# Patient Record
Sex: Male | Born: 1945
Health system: Southern US, Community
[De-identification: ages and names within clinical notes are randomized; demographics above are authoritative.]

## PROBLEM LIST (undated history)

## (undated) DIAGNOSIS — Z87891 Personal history of nicotine dependence: Secondary | ICD-10-CM

## (undated) DIAGNOSIS — IMO0001 Reserved for inherently not codable concepts without codable children: Secondary | ICD-10-CM

## (undated) DIAGNOSIS — R011 Cardiac murmur, unspecified: Secondary | ICD-10-CM

## (undated) DIAGNOSIS — Z8719 Personal history of other diseases of the digestive system: Secondary | ICD-10-CM

## (undated) DIAGNOSIS — Z9289 Personal history of other medical treatment: Secondary | ICD-10-CM

## (undated) DIAGNOSIS — Z9889 Other specified postprocedural states: Secondary | ICD-10-CM

## (undated) DIAGNOSIS — I10 Essential (primary) hypertension: Secondary | ICD-10-CM

## (undated) DIAGNOSIS — I059 Rheumatic mitral valve disease, unspecified: Secondary | ICD-10-CM

## (undated) DIAGNOSIS — K5190 Ulcerative colitis, unspecified, without complications: Secondary | ICD-10-CM

## (undated) DIAGNOSIS — C61 Malignant neoplasm of prostate: Secondary | ICD-10-CM

## (undated) DIAGNOSIS — K219 Gastro-esophageal reflux disease without esophagitis: Secondary | ICD-10-CM

## (undated) HISTORY — DX: Ulcerative colitis, unspecified, without complications: K51.90

## (undated) HISTORY — DX: Reserved for inherently not codable concepts without codable children: IMO0001

## (undated) HISTORY — DX: Personal history of other medical treatment: Z92.89

## (undated) HISTORY — DX: Personal history of nicotine dependence: Z87.891

## (undated) HISTORY — DX: Rheumatic mitral valve disease, unspecified: I05.9

## (undated) HISTORY — DX: Gastro-esophageal reflux disease without esophagitis: K21.9

## (undated) HISTORY — DX: Personal history of other diseases of the digestive system: Z87.19

## (undated) HISTORY — DX: Cardiac murmur, unspecified: R01.1

## (undated) HISTORY — DX: Other specified postprocedural states: Z98.890

## (undated) HISTORY — DX: Malignant neoplasm of prostate: C61

## (undated) HISTORY — DX: Essential (primary) hypertension: I10

---
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
## 1981-12-09 HISTORY — PX: LUMBAR DISC SURGERY: SHX700

## 2001-07-01 ENCOUNTER — Encounter (INDEPENDENT_AMBULATORY_CARE_PROVIDER_SITE_OTHER): Payer: Self-pay | Admitting: Specialist

## 2001-07-01 ENCOUNTER — Other Ambulatory Visit: Admission: RE | Admit: 2001-07-01 | Discharge: 2001-07-01 | Payer: Self-pay | Admitting: Urology

## 2001-08-05 ENCOUNTER — Encounter: Payer: Self-pay | Admitting: Urology

## 2001-08-09 HISTORY — PX: PROSTATECTOMY: SHX69

## 2001-08-12 ENCOUNTER — Inpatient Hospital Stay (HOSPITAL_COMMUNITY): Admission: RE | Admit: 2001-08-12 | Discharge: 2001-08-15 | Payer: Self-pay | Admitting: Urology

## 2001-08-12 ENCOUNTER — Encounter (INDEPENDENT_AMBULATORY_CARE_PROVIDER_SITE_OTHER): Payer: Self-pay | Admitting: *Deleted

## 2005-12-05 ENCOUNTER — Ambulatory Visit: Payer: Self-pay | Admitting: Oncology

## 2005-12-17 ENCOUNTER — Ambulatory Visit: Admission: RE | Admit: 2005-12-17 | Discharge: 2006-02-28 | Payer: Self-pay | Admitting: Radiation Oncology

## 2006-06-12 ENCOUNTER — Ambulatory Visit: Admission: RE | Admit: 2006-06-12 | Discharge: 2006-07-30 | Payer: Self-pay | Admitting: Radiation Oncology

## 2006-06-12 LAB — CBC WITH DIFFERENTIAL/PLATELET
Basophils Absolute: 0 10*3/uL (ref 0.0–0.1)
Eosinophils Absolute: 0.2 10*3/uL (ref 0.0–0.5)
HCT: 44.5 % (ref 38.7–49.9)
HGB: 15.4 g/dL (ref 13.0–17.1)
MONO#: 0.6 10*3/uL (ref 0.1–0.9)
NEUT%: 76.9 % — ABNORMAL HIGH (ref 40.0–75.0)
Platelets: 351 10*3/uL (ref 145–400)
WBC: 8.2 10*3/uL (ref 4.0–10.0)
lymph#: 1.1 10*3/uL (ref 0.9–3.3)

## 2006-06-12 LAB — COMPREHENSIVE METABOLIC PANEL
ALT: 14 U/L (ref 0–40)
BUN: 11 mg/dL (ref 6–23)
CO2: 27 mEq/L (ref 19–32)
Calcium: 9.9 mg/dL (ref 8.4–10.5)
Chloride: 102 mEq/L (ref 96–112)
Creatinine, Ser: 1.08 mg/dL (ref 0.40–1.50)
Glucose, Bld: 116 mg/dL — ABNORMAL HIGH (ref 70–99)

## 2006-06-17 ENCOUNTER — Encounter: Admission: RE | Admit: 2006-06-17 | Discharge: 2006-06-17 | Payer: Self-pay | Admitting: Geriatric Medicine

## 2006-09-08 DIAGNOSIS — K5190 Ulcerative colitis, unspecified, without complications: Secondary | ICD-10-CM

## 2006-09-08 HISTORY — DX: Ulcerative colitis, unspecified, without complications: K51.90

## 2008-01-13 ENCOUNTER — Ambulatory Visit (HOSPITAL_COMMUNITY): Admission: RE | Admit: 2008-01-13 | Discharge: 2008-01-13 | Payer: Self-pay | Admitting: Urology

## 2008-01-31 ENCOUNTER — Encounter: Admission: RE | Admit: 2008-01-31 | Discharge: 2008-01-31 | Payer: Self-pay | Admitting: Geriatric Medicine

## 2008-02-24 ENCOUNTER — Ambulatory Visit (HOSPITAL_COMMUNITY): Admission: RE | Admit: 2008-02-24 | Discharge: 2008-02-25 | Payer: Self-pay | Admitting: Neurosurgery

## 2008-12-09 HISTORY — PX: OTHER SURGICAL HISTORY: SHX169

## 2009-08-24 ENCOUNTER — Encounter: Admission: RE | Admit: 2009-08-24 | Discharge: 2009-08-24 | Payer: Self-pay | Admitting: Geriatric Medicine

## 2010-12-30 ENCOUNTER — Encounter: Payer: Self-pay | Admitting: Urology

## 2011-04-23 NOTE — Op Note (Signed)
NAMEDRAYDON, CLAIRMONT             ACCOUNT NO.:  [id number]   MEDICAL RECORD NO.:  [id number]          PATIENT TYPE:  OIB   LOCATION:  3534                         FACILITY:  [facility]   PHYSICIAN:  Donalee Citrin, M.D.        DATE OF BIRTH:  [birth year]   DATE OF PROCEDURE:  02/24/2008  DATE OF DISCHARGE:                               OPERATIVE REPORT   PREOPERATIVE DIAGNOSIS:  Lumbar spinal stenosis and ruptured disk L3-4.   PROCEDURE:  Decompress laminectomy L3-4 with microdiskectomy at L3-4  from the left side with microscopic dissection of the left L4 nerve  root.   SURGEON:  Donalee Citrin, M.D.   ASSISTANT:  Reinaldo Meeker, M.D.   ANESTHESIA:  General anesthesia.   HISTORY OF PRESENT ILLNESS:  The patient is a very pleasant 65 year old  gentleman who presents with bilateral leg pain, much worse on the left,  radiating down to his quads with neurogenic claudication at very short  distances.  Leg pain was much greater than back pain.  MRI scan showed  very large disk herniation at L3-4 causing severe spinal stenosis and  thecal sac compression with bi-foraminal stenosis at the L3-4 nerve  root.  Also showed degenerative disk disease at L4-5 and L5-S1 with  spinal stenosis at these levels as well.  Due to the patient's size of  the disk herniation, he has quadriceps weakness on preoperative exam and  after failure of conservative treatment, patient was recommended  decompressive laminectomy and diskectomy and due to the size and  location of the fragment, he was recommended bilateral decompression  with diskectomy from the left, it the worsening side of his symptoms.  Risks and benefits of the operation were explained to the patient.  He  understands and agreed to proceed forward.  Complications were  additional decompression at L4-5 due to be severe facet arthropathy and  degenerative disk disease.  The anatomy was distorted came down the 4-5  disk space, decompressed there.   Intraoperative x-ray confirmed  localization of this to be the wrong level, so attention was taken and  marched up across at the next level up.   PROCEDURE AND COURSE:  The patient brought to the OR and was induced  under general anesthesia, positioned prone on the Wilson frame.  Back is  prepped and draped in the usual sterile fashion.  Preoperatively x-ray  localized the L4 spinous process so a midline incision was made superior  to this after infiltration with 10 mL of lidocaine with epinephrine and  subperiosteal dissection carried out at the lamina of L3-4.  Intraoperative identified the L3-4 disk space.  However, there was noted  to be severe facet arthropathy and degenerative collapse at this level  and the laminar landmarks were very distorted with a very steep course  of the lamina and marked facet arthropathy, so the spinous process of  what was believed to be L3 was removed and central decompression was  begun.  There was noted be tremendous thecal sac compression in an  hourglass fashion at this level.  Using a IV Penfield the dural  plane  was developed and adequate medial facetectomy was performed on the left  side extending the laminotomy to the right only about half the distance  to the facet complex just enough to gain safe exposure to the central  dura for further decompression.  At this point, the decompression was  carried out laterally at what was believed to be the L4 nerve was  identified.  This was noted to be marked stenosis from the degenerative  facet complex and extending in the foramen.  Looking underneath the 4  root, a large ridge of what was believed to be disk herniation was  immediately identified as well as there was disk herniation at the disk  space level just inferior to this and this was all incised and cleaned  out, however, there was no large free fragment appreciated.  A probe was  placed in the disk space and intraoperative repeat x-ray confirmed  this  to be the 4-5 disk space.  This was noted to be under marked stenosis  and due to the degenerative facet complex and the marked facet  arthropathy, this was in need of decompression as well, but because this  was confirmed to be the wrong level, subperiosteal dissection was  carried further on the lamina above and the laminotomy was extended  superiorly again with aggressive laminectomy and medial facetectomies on  the left side but only partial on the right.  Then again with severe  facet arthropathy mark causing marked stenosis at the superior level,  this was all teased away with a IV Penfield under-bitten.  At this  point, the operating microscope was draped and brought onto the field  again, and working under the microscope the remainder of the lateral  gutter was under-bitten, identifying the 4 root flush to the pedicle.  A  IV Nicholos Johns was then used to dissect the 4 root off of a very large disk  herniation still contained within the ligament.  This was held away with  the D'Errico.  Annulotomy was done with an 11 blade scalpel and using a  nerve hook, a very large free fragment disk immediately was teased out  and then the disk space was further incised and cleaned out radically.  At the end of the diskectomy at this level, there was no further  stenosis on the thecal sac.  It was copiously explored with a coronary  dilating angled hockey stick and noted to have no further stenosis.  With a combination pituitary rongeurs and downgoing Epstein curette,  this disk space was cleaned out and there was no further stenosis on the  thecal sac or the L4 and the L3 pedicle was then palpated with a  coronary dilator to confirm adequate superior exposure and the L3  foramen was widely patent.  At this point, Gelfoam was overlaid at the  foraminotomy level to maintain hemostasis.  The wound was then copiously  irrigated.  Gelfoam was removed.  Gelfoam was overlaid on top of the  dura.   The muscle was then coagulated to maintain meticulous hemostasis.  Wound was copiously irrigated.  A medium Hemovac drain was placed due to  the extensiveness of the decompression to include an additional level.  Then the muscle and fascia were approximated with interrupted Vicryls  and skin closed with running 4-0 subcuticular.  Benzoin and Steri-Strips  applied.  The patient went to the recovery room in stable condition.  At  the end of the case, all sponge and needle  counts were correct.           ______________________________  Donalee Citrin, M.D.     GC/MEDQ  D:  02/24/2008  T:  02/24/2008  Job:  [id number]

## 2011-04-23 NOTE — Op Note (Signed)
NAMEDONN, ZANETTI             ACCOUNT NO.:  [id number]   MEDICAL RECORD NO.:  [id number]          PATIENT TYPE:  OIB   LOCATION:  3534                         FACILITY:  [facility]   PHYSICIAN:  Donalee Citrin, M.D.        DATE OF BIRTH:  1946/02/12   DATE OF PROCEDURE:  02/24/2008  DATE OF DISCHARGE:                               OPERATIVE REPORT   PREOPERATIVE DIAGNOSIS:  Ruptured disk L3-4, lumbar spinal stenosis L3-  4.   PROCEDURE:  Decompressive lumbar laminectomy L3-4 with diskectomy L3-4  from the left as well as decompressed laminectomy L4-5.   Dictation stopped at this point.           ______________________________  Donalee Citrin, M.D.     GC/MEDQ  D:  02/24/2008  T:  02/24/2008  Job:  [id number]

## 2011-04-26 NOTE — H&P (Signed)
[facility]  Patient:    David Warner, David Warner Visit Number: [id number] MRN: [id number]          Service Type: SUR Location: 3W 0349 01 Attending Physician:  Katherine Roan Dictated by:   Rozanna Boer., M.D. Proc. 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  LABORATORIES:  He was done for a rise in his PSA on May 2002. PSA was 4.7 at that time. It was 1.2 in December 1999, 1.3 in June 1999, 3.14 Apr 1998, and 1.08 Apr 1997.  After discussing the options, he chose radical surgery understanding the risks, including, but not limited to, incontinence, deep vein thrombosis, bleeding, impotence, pulmonary emboli and death. He auto donated two units of blood and underwent mechanical bowel prep the day before surgery.  MEDICATIONS:  Prilosec 20 mg p.o. q.h.s. for GERD.  ALLERGIES:  No known drug allergies.  PREVIOUS OPERATIONS:  He had three abdominal ventral hernia repairs, 1998 was the last. In 1968 he had a left shoulder operation. In 1986 he had back surgery. In 1987 he had a vasectomy.  REVIEW OF SYSTEMS:  He has been a nonsmoker for 20+ years. No cardiac or pulmonary symptomatology. No IBS or bowel changes. He has had previous microscopic hematuria off and on which was thought to be of benign of origin. He has had some mild arthritis and some mild low back pain.  FAMILY HISTORY AND SOCIAL HISTORY:  He is married with a son at home and a daughter, Clair Gulling, at [employer]. 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 He is [place] by birth. He and his wife are in good health. Family history and past history are noted on the chart.  PHYSICAL  EXAMINATION:  VITAL SIGNS:  Temperature is afebrile. Pulse is 78, weight is 215. Blood pressure 148/81.  GENERAL:  He is a healthy, stocky white male with full gray beard in no acute distress.  HEENT:  Clear.  CHEST:  Clear to percussion, auscultation. No murmurs, gallops, or rubs.  ABDOMEN:  Soft and benign with an upper midline abdominal scar. No recurrent hernia. Umbilicus is missing from previous repair. No hepatosplenomegaly.  GENITOURINARY:  Penis is normal circumcised. Bilateral descended testes. Epidydimes nontender. His prostate is about 25 g, not fixed or indurated. SVs not palpated. Left upper pole spermatocele noted about 1.5 cm in size.  EXTREMITIES:  Negative, no edema.  IMPRESSION: 1. 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 2. Left spermatocele.  RECOMMENDATIONS:  Recommend radical retropubic prostatectomy and bilateral lymph node dissection as discussed. Dictated by:   Rozanna Boer., M.D. Attending Physician:  Katherine Roan DD:  08/12/01 TD:  08/12/01 Job: [id number]

## 2011-04-26 NOTE — Discharge Summary (Signed)
[facility]  Patient:    David Warner, David Warner Visit Number: [id number] MRN: [id number]          Service Type: SUR Location: 3W 0349 01 Attending Physician:  Katherine Roan Dictated by:   Rozanna Boer., M.D. Admit Date:  08/12/2001 Disc. Date: 08/15/01   CC:         Hal T. Stoneking, M.D.   Discharge Summary  DISCHARGE DIAGNOSES: 1. A T2b Gleason 4+3 adenocarcinoma of the prostate. 2. Gastroesophageal reflux disease. 3. Left spermatocele.  OPERATIONS:  Radical retropubic prostatectomy, bilateral pelvic lymph node dissection on August 12, 2001.  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 His PSA was elevated in May at 4.7.  It was 1.09 November 1998, 1.11 May 1998, but 3.14 Apr 1998, and 1.08 Apr 1997.  He had a biopsy which showed bilateral cancer, Gleason 4 + 3.  It was 30% on the left and less than 5% on the right. After discussing the options he chose surgery including, but not limited to, risks of incontinence, deep vein thrombosis, bleeding, pulmonary emboli, impotence, and death.  He autodonated two units of blood and had a mechanical bowel prep before surgery.  HOSPITAL COURSE:  He was taken to the operating room where he underwent surgery without difficulty.  His preoperative values showed a preoperative hematocrit of 39%.  His renal function tests were normal.  At surgery it was fairly difficult and scarred down both at the bladder neck, the apex, and the posterior capsule which all seemed to be quite adherent, but the nodes appeared to be negative, and the prostate was taken out without difficulty. Postoperatively he did well.  He was started on clear liquids, advanced to full, and then by the second postoperative day was on a regular diet.  The pathology report showed a pathological T2b Gleason 4+3  adenocarcinoma of the prostate involving both lobes, but all the margins including the apex, the bladder neck, and the separate apex tissue, the posterior capsule, seminal vesicles, and nodes were all negative for cancer.  He was passing gas and had just a low-grade fever of 100 degrees on the second postoperative day.  The JP drain was decreasing and will be removed before he is discharged on the third postoperative day.  He will go home only on Percocet for pain.  He will come back in four days for staple removal.  Detailed instructions were given to the patient.  He was sent home in an improved ambulatory condition. Dictated by:   Rozanna Boer., M.D. Attending Physician:  Katherine Roan DD:  08/14/01 TD:  08/15/01 Job: [id number]

## 2011-04-26 NOTE — Op Note (Signed)
[facility]  Patient:    David Warner, David Warner Visit Number: [id number] MRN: [id number]          Service Type: SUR Location: 3W 0349 01 Attending Physician:  Katherine Roan Dictated by:   Rozanna Boer., M.D. Proc. 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  POSTOPERATIVE DIAGNOSIS:  Clinical T2c Gleason 4+3 adenocarcinoma of the prostate, pending pathology report.  PROCEDURE:  Radical retropubic prostatectomy, bilateral pelvic lymph node dissection.  ANESTHESIA:  General.  SURGEON:  Rozanna Boer., M.D.  ASSISTANT:  Bertram Millard. Dahlstedt, M.D.  ESTIMATED BLOOD LOSS:  1800 cc.  DESCRIPTION OF PROCEDURE:  The patient was placed on the operating table in the supine position with a rolled towel underneath his sacrum after satisfactory condition induction of general endotracheal anesthesia. He had a anteriorly placed cords and Dr. Almeta Monas had to intubate him. He was shaved, prepped and draped in the usual sterile fashion in the lower midline abdomen. A Foley catheter #24 [place], 30 cc balloon, was inserted in the urethra. The bladder was drained and a midline incision was made, carried down through the midline fascia between the umbilicus and the symphysis pubis to expose the retroperitoneum. This was opened, packed open with a self-retaining retractor. The nodes on his right side were first dissected beginning at the external iliac down to and including the bifurcation of the iliac down to the obturator fossa. He had a fairly large packet which was removed. Efferent lymphatics were either tied or clipped as they were encountered and care was taken to preserve the obturator nerve which was kept in full view at all times. Endopelvic fascia was then incised with the Bovie on this side. On  the patients left side, where he had most of his cancer on biopsy, there was not as big a packet. The nodes again felt benign. The packet from the external iliac down to and including the obturator fossa back to the bifurcation iliac was also taken. The efferent lymphatics were either clipped or tied as they were encountered. Again, hemostasis was quite good and the obturator nerve was carefully protected. The endopelvic fascia was incised with the Bovie on this side as well. Then, the puboprostatic ligaments were cut under direct vision and the ______ clamp passed underneath the dorsal vein complex on two passes just anterior to the urethra which could be palpated. A back tie with 0 Vicryl suture was then made and a back tie on the bladder neck was also noted. It seemed like there was some tissue from the bladder neck was incorporated in the dorsal vein complex so another tie and suture were placed a little bit more distal, and these structures were then incised with the Bovie. This exposed the urethra but it did look like there was a little bit of apical tissue that was quite close at this point. There was not much bleeding but I was able to get a right angle clamp underneath the urethra and passed an umbilical tape on it and was able to cut the urethra under direct vision, going distally as much as possible to try to incorporate any apical tissue that might have been present at this point. It was somewhat adherent posteriorly and the posterior  capsule did not come easily off the rectum. It was felt at this point, it was probably easier to go anteriorly at the bladder neck and work distally in that fashion to insure that all the tumor was removed. So, the bladder neck was then identified and using a ______ clamp the urethra was carefully dissected out of the prostate in this area and incised.  The prostate was then carefully dissected off the bladder leaving the bladder neck intact. The  vas deferens were easily identified, clipped, and the rather large seminal vesicles were then traced out to their tips and ligated with a suture, and then sweeping everything anteriorly and inferiorly could sweep the seminal vesicle and prostate off the rectum quite easily in this plane down to the apex. The pedicle was tied as it was encountered with 0 silk ties, and the prostate was then removed intact. The posterior capsule looked to be intact. It looked like there might have been a little bit of apical tissue in the dorsal vein complex so this was sent as a separate specimen by placing a right angle clamp around the dorsal vein complex and excising this tissue. A separate suture of 2-0 chromic catgut had to be then passed underneath the suture that ligated the dorsal vein complex to ______ effective hemostasis. The wound was irrigated and seemed to be fairly dry so the bladder neck was then closed with interrupted 3-0 chromic catgut suture everting the mucosa circumferentially around the bladder neck with simple sutures. With a sound in the urethra, sutures were placed at 1, 3, 5, 7, and  9 oclock with 2-0 Vicryl and then the sound was removed. A #20 Foley catheter was then inserted through the urethra, passed in the bladder neck, and the balloon inflated to 15 cc. The corresponding urethral stitches were then placed on the bladder neck, and the corresponding quadrants, and when these were tightened down, the anastomosis seemed to be water tight. The irrigant was clear. A JP drain was then brought out through a separate stab incision on the right side, sutured to the skin with nylon and the skin closed with running #1 PDS suture. Clips were then used for the skin. Sponge, instrument, and needle counts were correct. The estimated blood loss was about 1800 cc. He did receive two units of autologous blood as when his hematocrit was 28, they were having to run fluids pretty fast to support  his blood pressure. It was felt that since this was autologous, go ahead and give his blood at this time. The catheter was once more irrigated and found to be clear. The patient was then taken and  given 30 mg of Toradol, taken to recovery room in good condition where he will be admitted for postoperative care. Dictated by:   Rozanna Boer., M.D. Attending Physician:  Katherine Roan DD:  08/12/01 TD:  08/12/01 Job: [id number]

## 2011-09-02 LAB — CBC
HCT: 43.5
Hemoglobin: 15.1
Platelets: 240
RBC: 5.04
WBC: 8.1

## 2011-09-25 ENCOUNTER — Other Ambulatory Visit (HOSPITAL_COMMUNITY): Payer: Self-pay | Admitting: Urology

## 2011-09-25 DIAGNOSIS — R51 Headache: Secondary | ICD-10-CM

## 2011-09-25 DIAGNOSIS — C61 Malignant neoplasm of prostate: Secondary | ICD-10-CM

## 2011-09-29 ENCOUNTER — Ambulatory Visit (HOSPITAL_COMMUNITY)
Admission: RE | Admit: 2011-09-29 | Discharge: 2011-09-29 | Disposition: A | Discharge status: Home / Self Care | Payer: BC Managed Care – PPO | Source: Ambulatory Visit | Attending: Urology | Admitting: Urology

## 2011-09-29 DIAGNOSIS — R51 Headache: Secondary | ICD-10-CM

## 2011-09-29 DIAGNOSIS — C61 Malignant neoplasm of prostate: Secondary | ICD-10-CM

## 2011-09-29 LAB — CREATININE, SERUM: GFR calc Af Amer: >90 mL/min (ref >90)

## 2011-09-29 MED ORDER — GADOBENATE DIMEGLUMINE 529 MG/ML IV SOLN
20.0000 mL | Freq: Once | INTRAVENOUS | Status: AC | PRN
  Administered 2011-09-29: 20 mL via INTRAVENOUS

## 2011-11-06 ENCOUNTER — Other Ambulatory Visit: Payer: Self-pay | Admitting: Geriatric Medicine

## 2011-11-06 ENCOUNTER — Ambulatory Visit
Admission: RE | Admit: 2011-11-06 | Discharge: 2011-11-06 | Disposition: A | Discharge status: Home / Self Care | Payer: BC Managed Care – PPO | Source: Ambulatory Visit | Attending: Geriatric Medicine | Admitting: Geriatric Medicine

## 2013-05-06 DIAGNOSIS — C61 Malignant neoplasm of prostate: Secondary | ICD-10-CM | POA: Diagnosis not present

## 2013-05-13 DIAGNOSIS — N529 Male erectile dysfunction, unspecified: Secondary | ICD-10-CM | POA: Diagnosis not present

## 2013-05-13 DIAGNOSIS — C61 Malignant neoplasm of prostate: Secondary | ICD-10-CM | POA: Diagnosis not present

## 2013-06-04 DIAGNOSIS — C61 Malignant neoplasm of prostate: Secondary | ICD-10-CM | POA: Diagnosis not present

## 2013-06-28 DIAGNOSIS — I10 Essential (primary) hypertension: Secondary | ICD-10-CM | POA: Diagnosis not present

## 2013-06-28 DIAGNOSIS — R011 Cardiac murmur, unspecified: Secondary | ICD-10-CM | POA: Diagnosis not present

## 2013-06-28 DIAGNOSIS — Z23 Encounter for immunization: Secondary | ICD-10-CM | POA: Diagnosis not present

## 2013-06-28 DIAGNOSIS — Z79899 Other long term (current) drug therapy: Secondary | ICD-10-CM | POA: Diagnosis not present

## 2013-06-28 DIAGNOSIS — Z0000 Encounter for general adult medical examination without abnormal findings: Secondary | ICD-10-CM | POA: Diagnosis not present

## 2013-06-28 DIAGNOSIS — K5289 Other specified noninfective gastroenteritis and colitis: Secondary | ICD-10-CM | POA: Diagnosis not present

## 2013-06-30 DIAGNOSIS — Z0000 Encounter for general adult medical examination without abnormal findings: Secondary | ICD-10-CM | POA: Diagnosis not present

## 2013-06-30 DIAGNOSIS — Z1159 Encounter for screening for other viral diseases: Secondary | ICD-10-CM | POA: Diagnosis not present

## 2013-07-05 DIAGNOSIS — R011 Cardiac murmur, unspecified: Secondary | ICD-10-CM | POA: Diagnosis not present

## 2013-08-02 DIAGNOSIS — I10 Essential (primary) hypertension: Secondary | ICD-10-CM | POA: Diagnosis not present

## 2013-08-02 DIAGNOSIS — I059 Rheumatic mitral valve disease, unspecified: Secondary | ICD-10-CM | POA: Diagnosis not present

## 2013-08-02 DIAGNOSIS — K5100 Ulcerative (chronic) pancolitis without complications: Secondary | ICD-10-CM | POA: Diagnosis not present

## 2013-08-30 DIAGNOSIS — I10 Essential (primary) hypertension: Secondary | ICD-10-CM | POA: Diagnosis not present

## 2013-08-30 DIAGNOSIS — Z79899 Other long term (current) drug therapy: Secondary | ICD-10-CM | POA: Diagnosis not present

## 2013-09-07 DIAGNOSIS — R7989 Other specified abnormal findings of blood chemistry: Secondary | ICD-10-CM | POA: Diagnosis not present

## 2013-10-12 DIAGNOSIS — C61 Malignant neoplasm of prostate: Secondary | ICD-10-CM | POA: Diagnosis not present

## 2013-11-09 DIAGNOSIS — N529 Male erectile dysfunction, unspecified: Secondary | ICD-10-CM | POA: Diagnosis not present

## 2013-11-19 ENCOUNTER — Other Ambulatory Visit: Payer: Self-pay | Admitting: Geriatric Medicine

## 2013-11-19 ENCOUNTER — Ambulatory Visit
Admission: RE | Admit: 2013-11-19 | Discharge: 2013-11-19 | Disposition: A | Discharge status: Home / Self Care | Payer: Medicare Other | Source: Ambulatory Visit | Attending: Geriatric Medicine | Admitting: Geriatric Medicine

## 2013-11-19 DIAGNOSIS — R351 Nocturia: Secondary | ICD-10-CM | POA: Diagnosis not present

## 2013-11-19 DIAGNOSIS — I10 Essential (primary) hypertension: Secondary | ICD-10-CM

## 2013-11-19 DIAGNOSIS — R7301 Impaired fasting glucose: Secondary | ICD-10-CM | POA: Diagnosis not present

## 2013-11-19 DIAGNOSIS — R05 Cough: Secondary | ICD-10-CM | POA: Diagnosis not present

## 2013-11-19 DIAGNOSIS — R9389 Abnormal findings on diagnostic imaging of other specified body structures: Secondary | ICD-10-CM | POA: Diagnosis not present

## 2013-11-19 DIAGNOSIS — Z79899 Other long term (current) drug therapy: Secondary | ICD-10-CM | POA: Diagnosis not present

## 2013-11-19 DIAGNOSIS — I059 Rheumatic mitral valve disease, unspecified: Secondary | ICD-10-CM | POA: Diagnosis not present

## 2013-11-19 DIAGNOSIS — Z01818 Encounter for other preprocedural examination: Secondary | ICD-10-CM | POA: Diagnosis not present

## 2013-11-24 DIAGNOSIS — R7989 Other specified abnormal findings of blood chemistry: Secondary | ICD-10-CM | POA: Diagnosis not present

## 2013-11-24 DIAGNOSIS — R7301 Impaired fasting glucose: Secondary | ICD-10-CM | POA: Diagnosis not present

## 2013-12-22 DIAGNOSIS — N529 Male erectile dysfunction, unspecified: Secondary | ICD-10-CM | POA: Diagnosis not present

## 2013-12-22 DIAGNOSIS — N486 Induration penis plastica: Secondary | ICD-10-CM | POA: Diagnosis not present

## 2013-12-22 DIAGNOSIS — N508 Other specified disorders of male genital organs: Secondary | ICD-10-CM | POA: Diagnosis not present

## 2014-01-19 DIAGNOSIS — C61 Malignant neoplasm of prostate: Secondary | ICD-10-CM | POA: Diagnosis not present

## 2014-02-07 DIAGNOSIS — C61 Malignant neoplasm of prostate: Secondary | ICD-10-CM | POA: Diagnosis not present

## 2014-02-07 DIAGNOSIS — R972 Elevated prostate specific antigen [PSA]: Secondary | ICD-10-CM | POA: Diagnosis not present

## 2014-03-15 DIAGNOSIS — C61 Malignant neoplasm of prostate: Secondary | ICD-10-CM | POA: Diagnosis not present

## 2014-06-02 DIAGNOSIS — R972 Elevated prostate specific antigen [PSA]: Secondary | ICD-10-CM | POA: Diagnosis not present

## 2014-06-02 DIAGNOSIS — C61 Malignant neoplasm of prostate: Secondary | ICD-10-CM | POA: Diagnosis not present

## 2014-06-14 DIAGNOSIS — C61 Malignant neoplasm of prostate: Secondary | ICD-10-CM | POA: Diagnosis not present

## 2014-06-21 ENCOUNTER — Encounter: Payer: Self-pay | Admitting: Interventional Cardiology

## 2014-07-04 DIAGNOSIS — Z79899 Other long term (current) drug therapy: Secondary | ICD-10-CM | POA: Diagnosis not present

## 2014-07-04 DIAGNOSIS — Z1331 Encounter for screening for depression: Secondary | ICD-10-CM | POA: Diagnosis not present

## 2014-07-04 DIAGNOSIS — I10 Essential (primary) hypertension: Secondary | ICD-10-CM | POA: Diagnosis not present

## 2014-07-04 DIAGNOSIS — Z0000 Encounter for general adult medical examination without abnormal findings: Secondary | ICD-10-CM | POA: Diagnosis not present

## 2014-07-29 DIAGNOSIS — E7800 Pure hypercholesterolemia, unspecified: Secondary | ICD-10-CM | POA: Diagnosis not present

## 2014-08-01 ENCOUNTER — Other Ambulatory Visit (HOSPITAL_COMMUNITY): Payer: Medicare Other

## 2014-08-01 ENCOUNTER — Ambulatory Visit: Payer: BC Managed Care – PPO | Admitting: Interventional Cardiology

## 2014-08-23 DIAGNOSIS — H25019 Cortical age-related cataract, unspecified eye: Secondary | ICD-10-CM | POA: Diagnosis not present

## 2014-09-05 ENCOUNTER — Encounter: Payer: Self-pay | Admitting: Cardiology

## 2014-09-05 DIAGNOSIS — I059 Rheumatic mitral valve disease, unspecified: Secondary | ICD-10-CM | POA: Insufficient documentation

## 2014-09-05 DIAGNOSIS — I10 Essential (primary) hypertension: Secondary | ICD-10-CM

## 2014-09-08 ENCOUNTER — Other Ambulatory Visit (HOSPITAL_COMMUNITY): Payer: Medicare Other

## 2014-09-08 ENCOUNTER — Ambulatory Visit (INDEPENDENT_AMBULATORY_CARE_PROVIDER_SITE_OTHER): Payer: Medicare Other | Admitting: Interventional Cardiology

## 2014-09-08 ENCOUNTER — Other Ambulatory Visit (HOSPITAL_COMMUNITY): Payer: Self-pay | Admitting: Interventional Cardiology

## 2014-09-08 ENCOUNTER — Encounter: Payer: Self-pay | Admitting: Interventional Cardiology

## 2014-09-08 ENCOUNTER — Ambulatory Visit (HOSPITAL_COMMUNITY): Payer: Medicare Other | Attending: Cardiology | Admitting: Radiology

## 2014-09-08 VITALS — BP 130/80 | HR 72 | Ht 72.0 in | Wt 209.0 lb

## 2014-09-08 DIAGNOSIS — I10 Essential (primary) hypertension: Secondary | ICD-10-CM | POA: Insufficient documentation

## 2014-09-08 DIAGNOSIS — I059 Rheumatic mitral valve disease, unspecified: Secondary | ICD-10-CM

## 2014-09-08 DIAGNOSIS — Z87891 Personal history of nicotine dependence: Secondary | ICD-10-CM | POA: Insufficient documentation

## 2014-09-08 NOTE — Progress Notes (Signed)
Echocardiogram performed.  

## 2014-09-08 NOTE — Patient Instructions (Signed)
Your physician recommends that you continue on your current medications as directed. Please refer to the Current Medication list given to you today.  Your physician wants you to follow-up in: 1 year with Dr. Varanasi. You will receive a reminder letter in the mail two months in advance. If you don't receive a letter, please call our office to schedule the follow-up appointment.  

## 2014-09-08 NOTE — Progress Notes (Signed)
Patient ID: David Warner, male   DOB: 1946/12/03, 68 y.o.   MRN: [id number]    [place], [address], [place]  [address] Phone: [phone] Fax:  [phone]  Date:  09/08/2014   ID:  David Warner, DOB [birth year], MRN [id number]  PCP:  Mathews Argyle, MD      History of Present Illness: David Warner is a 68 y.o. male who was found to have a murmur. He had an echo showing MVP with mild to moderate MR. His most strenuous activity is walking up the stairs. He has started lifting weights. Vacuuming is another activity. No sx. No orthopnea, PND. No leg swelling.  Lost 15 lbs through diet control.  Target, low 190s.    Wt Readings from Last 3 Encounters:  09/08/14 209 lb (94.802 kg)     Past Medical History  Diagnosis Date  . History of umbilical hernia repair     3 done 11/1997  . Prostate cancer     Dr. Jasmine December  . Ulcerative colitis 09/2006    Dr. Amedeo Plenty  . Mitral valve disorders     mild to moderate 7/14 echo  . Essential hypertension, benign     diagnosed 09/2004  . History of CT scan     showed Chronic Lung scarring 9/10  . Former smoker     u/s aorta 9/12  . GERD (gastroesophageal reflux disease)   . Aortic sclerosis     7/14 echo  . Heart murmur     Current Outpatient Prescriptions  Medication Sig Dispense Refill  . calcium carbonate (TUMS - DOSED IN MG ELEMENTAL CALCIUM) 500 MG chewable tablet Chew 1 tablet by mouth daily.      . cholecalciferol (VITAMIN D) 1000 UNITS tablet Take 1,000 Units by mouth daily.      Marland Kitchen Leuprolide Acetate (LUPRON DEPOT IM) Inject 5 mLs into the muscle as directed. 0.2 ml every 4 months      . lisinopril (PRINIVIL,ZESTRIL) 10 MG tablet Take 10 mg by mouth daily.       No current facility-administered medications for this visit.    Allergies:   Allergies no known allergies  Social History:  The patient  reports that he has quit smoking. He does not have any smokeless tobacco history on file. He  reports that he drinks alcohol. He reports that he does not use illicit drugs.   Family History:  The patient's family history includes CVA in his father; Dementia in his mother; Lung cancer in his sister; Vascular Disease in his father.   ROS:  Please see the history of present illness.  No nausea, vomiting.  No fevers, chills.  No focal weakness.  No dysuria.    All other systems reviewed and negative.   PHYSICAL EXAM: VS:  BP 130/80  Pulse 72  Ht 6' (1.829 m)  Wt 209 lb (94.802 kg)  BMI 28.34 kg/m2 Well nourished, well developed, in no acute distress HEENT: normal Neck: no JVD, no carotid bruits Cardiac:  normal S1, S2; RRR;  Lungs:  clear to auscultation bilaterally, no wheezing, rhonchi or rales Abd: soft, nontender, no hepatomegaly Ext: no edema Skin: warm and dry Neuro:   no focal abnormalities noted  EKG:     NSR, no ST segment changes  ASSESSMENT AND PLAN:  MVP (mitral valve prolapse)   Notes: Mild to moderate regurgitation. No symptoms.  Echo pending.   2. Essential hypertension, benign  Decreased Lisinopril Tablet, 10 MG,  1 tablet, Orally, Once a day, 30, Refills 11 as BP came down with weight loss. IMAGING: EKG    Overton,Shana 08/02/2013 10:19:22 AM > David Warner,David Warner 08/02/2013 10:47:58 AM > NSR, no significant ST changes   Notes:  Controlling BP will help reduce MR.    Preventive Medicine  Adult topics discussed:  Diet: healthy diet.  Exercise: 5 days a week, at least 30 minutes of aerobic exercise.      Signed, Mina Marble, MD, [facility] 09/08/2014 3:53 PM

## 2014-09-09 DIAGNOSIS — C61 Malignant neoplasm of prostate: Secondary | ICD-10-CM | POA: Diagnosis not present

## 2014-09-12 NOTE — Addendum Note (Signed)
Addended byUlla Potash H on: 09/12/2014 08:30 AM   Modules accepted: Orders

## 2014-09-16 DIAGNOSIS — Z1211 Encounter for screening for malignant neoplasm of colon: Secondary | ICD-10-CM | POA: Diagnosis not present

## 2014-09-16 DIAGNOSIS — K5730 Diverticulosis of large intestine without perforation or abscess without bleeding: Secondary | ICD-10-CM | POA: Diagnosis not present

## 2014-09-16 DIAGNOSIS — K621 Rectal polyp: Secondary | ICD-10-CM | POA: Diagnosis not present

## 2014-09-16 DIAGNOSIS — D126 Benign neoplasm of colon, unspecified: Secondary | ICD-10-CM | POA: Diagnosis not present

## 2014-09-19 DIAGNOSIS — C61 Malignant neoplasm of prostate: Secondary | ICD-10-CM | POA: Diagnosis not present

## 2014-12-13 DIAGNOSIS — Z23 Encounter for immunization: Secondary | ICD-10-CM | POA: Diagnosis not present

## 2014-12-13 DIAGNOSIS — R791 Abnormal coagulation profile: Secondary | ICD-10-CM | POA: Diagnosis not present

## 2014-12-13 DIAGNOSIS — M25461 Effusion, right knee: Secondary | ICD-10-CM | POA: Diagnosis not present

## 2014-12-13 DIAGNOSIS — M7989 Other specified soft tissue disorders: Secondary | ICD-10-CM | POA: Diagnosis not present

## 2014-12-14 ENCOUNTER — Encounter (HOSPITAL_COMMUNITY): Payer: PRIVATE HEALTH INSURANCE

## 2014-12-14 ENCOUNTER — Other Ambulatory Visit (HOSPITAL_COMMUNITY): Payer: Self-pay | Admitting: Geriatric Medicine

## 2014-12-14 DIAGNOSIS — M7989 Other specified soft tissue disorders: Secondary | ICD-10-CM

## 2014-12-15 ENCOUNTER — Ambulatory Visit (HOSPITAL_COMMUNITY)
Admission: RE | Admit: 2014-12-15 | Discharge: 2014-12-15 | Disposition: A | Discharge status: Home / Self Care | Payer: Medicare Other | Source: Ambulatory Visit | Attending: Geriatric Medicine | Admitting: Geriatric Medicine

## 2014-12-15 DIAGNOSIS — M7989 Other specified soft tissue disorders: Secondary | ICD-10-CM | POA: Insufficient documentation

## 2014-12-15 NOTE — Progress Notes (Signed)
Right lower extremity venous duplex completed.  Right:  No evidence of DVT, superficial thrombosis, or Baker's cyst.  Left:  Negative for DVT in the common femoral vein.  

## 2014-12-19 DIAGNOSIS — S83241A Other tear of medial meniscus, current injury, right knee, initial encounter: Secondary | ICD-10-CM | POA: Diagnosis not present

## 2014-12-19 DIAGNOSIS — C61 Malignant neoplasm of prostate: Secondary | ICD-10-CM | POA: Diagnosis not present

## 2014-12-26 DIAGNOSIS — C61 Malignant neoplasm of prostate: Secondary | ICD-10-CM | POA: Diagnosis not present

## 2015-01-12 ENCOUNTER — Other Ambulatory Visit: Payer: Self-pay | Admitting: Urology

## 2015-01-12 DIAGNOSIS — C61 Malignant neoplasm of prostate: Secondary | ICD-10-CM

## 2015-01-12 DIAGNOSIS — E3450 Androgen insensitivity syndrome, unspecified: Secondary | ICD-10-CM

## 2015-01-14 DIAGNOSIS — M1711 Unilateral primary osteoarthritis, right knee: Secondary | ICD-10-CM | POA: Diagnosis not present

## 2015-03-02 ENCOUNTER — Telehealth: Payer: Self-pay | Admitting: Interventional Cardiology

## 2015-03-02 DIAGNOSIS — I6529 Occlusion and stenosis of unspecified carotid artery: Secondary | ICD-10-CM

## 2015-03-02 NOTE — Telephone Encounter (Signed)
Please schedule carotid Doppler for carotid artery calcification.  Jettie Booze, MD

## 2015-03-03 NOTE — Telephone Encounter (Signed)
Per Dr. Irish Lack order Carotid doppler for carotid artery calcification

## 2015-03-08 NOTE — Telephone Encounter (Signed)
Spoke with pt and verified date and time of scheduled Carotid. Pt verbalized understanding and was in agreement with this plan.

## 2015-03-15 ENCOUNTER — Ambulatory Visit (HOSPITAL_COMMUNITY): Payer: Medicare Other | Attending: Cardiology | Admitting: Cardiology

## 2015-03-15 DIAGNOSIS — I6523 Occlusion and stenosis of bilateral carotid arteries: Secondary | ICD-10-CM | POA: Diagnosis not present

## 2015-03-15 DIAGNOSIS — I6529 Occlusion and stenosis of unspecified carotid artery: Secondary | ICD-10-CM

## 2015-03-15 NOTE — Progress Notes (Signed)
Carotid duplex performed 

## 2015-03-28 ENCOUNTER — Ambulatory Visit
Admission: RE | Admit: 2015-03-28 | Discharge: 2015-03-28 | Disposition: A | Discharge status: Home / Self Care | Payer: Medicare Other | Source: Ambulatory Visit | Attending: Urology | Admitting: Urology

## 2015-03-28 DIAGNOSIS — E3450 Androgen insensitivity syndrome, unspecified: Secondary | ICD-10-CM

## 2015-03-28 DIAGNOSIS — C61 Malignant neoplasm of prostate: Secondary | ICD-10-CM | POA: Diagnosis not present

## 2015-03-28 DIAGNOSIS — M6281 Muscle weakness (generalized): Secondary | ICD-10-CM | POA: Diagnosis not present

## 2015-03-28 DIAGNOSIS — Z13820 Encounter for screening for osteoporosis: Secondary | ICD-10-CM | POA: Diagnosis not present

## 2015-03-28 DIAGNOSIS — Z8546 Personal history of malignant neoplasm of prostate: Secondary | ICD-10-CM | POA: Diagnosis not present

## 2015-04-04 DIAGNOSIS — C61 Malignant neoplasm of prostate: Secondary | ICD-10-CM | POA: Diagnosis not present

## 2015-06-08 DIAGNOSIS — S83241D Other tear of medial meniscus, current injury, right knee, subsequent encounter: Secondary | ICD-10-CM | POA: Diagnosis not present

## 2015-07-31 DIAGNOSIS — C61 Malignant neoplasm of prostate: Secondary | ICD-10-CM | POA: Diagnosis not present

## 2015-08-07 DIAGNOSIS — C61 Malignant neoplasm of prostate: Secondary | ICD-10-CM | POA: Diagnosis not present

## 2015-09-26 DIAGNOSIS — Z79899 Other long term (current) drug therapy: Secondary | ICD-10-CM | POA: Diagnosis not present

## 2015-09-26 DIAGNOSIS — I10 Essential (primary) hypertension: Secondary | ICD-10-CM | POA: Diagnosis not present

## 2015-09-26 DIAGNOSIS — Z6830 Body mass index (BMI) 30.0-30.9, adult: Secondary | ICD-10-CM | POA: Diagnosis not present

## 2015-09-26 DIAGNOSIS — Z0000 Encounter for general adult medical examination without abnormal findings: Secondary | ICD-10-CM | POA: Diagnosis not present

## 2015-09-26 DIAGNOSIS — I340 Nonrheumatic mitral (valve) insufficiency: Secondary | ICD-10-CM | POA: Diagnosis not present

## 2015-09-26 DIAGNOSIS — Z23 Encounter for immunization: Secondary | ICD-10-CM | POA: Diagnosis not present

## 2015-09-26 DIAGNOSIS — E669 Obesity, unspecified: Secondary | ICD-10-CM | POA: Diagnosis not present

## 2015-09-26 DIAGNOSIS — C61 Malignant neoplasm of prostate: Secondary | ICD-10-CM | POA: Diagnosis not present

## 2015-09-26 DIAGNOSIS — Z1389 Encounter for screening for other disorder: Secondary | ICD-10-CM | POA: Diagnosis not present

## 2015-11-08 DIAGNOSIS — C61 Malignant neoplasm of prostate: Secondary | ICD-10-CM | POA: Diagnosis not present

## 2015-11-13 DIAGNOSIS — C61 Malignant neoplasm of prostate: Secondary | ICD-10-CM | POA: Diagnosis not present

## 2016-03-26 DIAGNOSIS — I10 Essential (primary) hypertension: Secondary | ICD-10-CM | POA: Diagnosis not present

## 2016-05-07 DIAGNOSIS — C61 Malignant neoplasm of prostate: Secondary | ICD-10-CM | POA: Diagnosis not present

## 2016-05-13 DIAGNOSIS — C61 Malignant neoplasm of prostate: Secondary | ICD-10-CM | POA: Diagnosis not present

## 2016-09-26 ENCOUNTER — Other Ambulatory Visit: Payer: Self-pay | Admitting: Geriatric Medicine

## 2016-09-26 DIAGNOSIS — E669 Obesity, unspecified: Secondary | ICD-10-CM | POA: Diagnosis not present

## 2016-09-26 DIAGNOSIS — C61 Malignant neoplasm of prostate: Secondary | ICD-10-CM | POA: Diagnosis not present

## 2016-09-26 DIAGNOSIS — I340 Nonrheumatic mitral (valve) insufficiency: Secondary | ICD-10-CM

## 2016-09-26 DIAGNOSIS — Z0000 Encounter for general adult medical examination without abnormal findings: Secondary | ICD-10-CM | POA: Diagnosis not present

## 2016-09-26 DIAGNOSIS — Z1389 Encounter for screening for other disorder: Secondary | ICD-10-CM | POA: Diagnosis not present

## 2016-09-26 DIAGNOSIS — Z79899 Other long term (current) drug therapy: Secondary | ICD-10-CM | POA: Diagnosis not present

## 2016-09-26 DIAGNOSIS — I10 Essential (primary) hypertension: Secondary | ICD-10-CM | POA: Diagnosis not present

## 2016-10-15 ENCOUNTER — Ambulatory Visit (HOSPITAL_COMMUNITY): Payer: Medicare Other | Attending: Cardiology

## 2016-10-15 ENCOUNTER — Other Ambulatory Visit: Payer: Self-pay

## 2016-10-15 DIAGNOSIS — I10 Essential (primary) hypertension: Secondary | ICD-10-CM | POA: Diagnosis not present

## 2016-10-15 DIAGNOSIS — C61 Malignant neoplasm of prostate: Secondary | ICD-10-CM | POA: Diagnosis not present

## 2016-10-15 DIAGNOSIS — I340 Nonrheumatic mitral (valve) insufficiency: Secondary | ICD-10-CM | POA: Diagnosis not present

## 2016-11-11 DIAGNOSIS — C61 Malignant neoplasm of prostate: Secondary | ICD-10-CM | POA: Diagnosis not present

## 2016-11-11 DIAGNOSIS — E559 Vitamin D deficiency, unspecified: Secondary | ICD-10-CM | POA: Diagnosis not present

## 2016-11-18 DIAGNOSIS — C61 Malignant neoplasm of prostate: Secondary | ICD-10-CM | POA: Diagnosis not present

## 2016-11-18 DIAGNOSIS — Z5111 Encounter for antineoplastic chemotherapy: Secondary | ICD-10-CM | POA: Diagnosis not present

## 2016-11-18 DIAGNOSIS — R9721 Rising PSA following treatment for malignant neoplasm of prostate: Secondary | ICD-10-CM | POA: Diagnosis not present

## 2016-12-10 DIAGNOSIS — H25013 Cortical age-related cataract, bilateral: Secondary | ICD-10-CM | POA: Diagnosis not present

## 2017-01-28 DIAGNOSIS — I10 Essential (primary) hypertension: Secondary | ICD-10-CM | POA: Diagnosis not present

## 2017-01-28 DIAGNOSIS — Z6830 Body mass index (BMI) 30.0-30.9, adult: Secondary | ICD-10-CM | POA: Diagnosis not present

## 2017-01-28 DIAGNOSIS — Z79899 Other long term (current) drug therapy: Secondary | ICD-10-CM | POA: Diagnosis not present

## 2017-01-28 DIAGNOSIS — E669 Obesity, unspecified: Secondary | ICD-10-CM | POA: Diagnosis not present

## 2017-02-17 DIAGNOSIS — Z8546 Personal history of malignant neoplasm of prostate: Secondary | ICD-10-CM | POA: Diagnosis not present

## 2017-03-10 DIAGNOSIS — Z113 Encounter for screening for infections with a predominantly sexual mode of transmission: Secondary | ICD-10-CM | POA: Diagnosis not present

## 2017-03-10 DIAGNOSIS — Z0000 Encounter for general adult medical examination without abnormal findings: Secondary | ICD-10-CM | POA: Diagnosis not present

## 2017-05-22 DIAGNOSIS — Z8546 Personal history of malignant neoplasm of prostate: Secondary | ICD-10-CM | POA: Diagnosis not present

## 2017-05-22 DIAGNOSIS — E559 Vitamin D deficiency, unspecified: Secondary | ICD-10-CM | POA: Diagnosis not present

## 2017-05-26 DIAGNOSIS — N393 Stress incontinence (female) (male): Secondary | ICD-10-CM | POA: Diagnosis not present

## 2017-05-26 DIAGNOSIS — C61 Malignant neoplasm of prostate: Secondary | ICD-10-CM | POA: Diagnosis not present

## 2017-07-25 DIAGNOSIS — M1711 Unilateral primary osteoarthritis, right knee: Secondary | ICD-10-CM | POA: Diagnosis not present

## 2017-07-25 DIAGNOSIS — S46011A Strain of muscle(s) and tendon(s) of the rotator cuff of right shoulder, initial encounter: Secondary | ICD-10-CM | POA: Diagnosis not present

## 2017-07-27 DIAGNOSIS — H00024 Hordeolum internum left upper eyelid: Secondary | ICD-10-CM | POA: Diagnosis not present

## 2017-08-12 DIAGNOSIS — M25511 Pain in right shoulder: Secondary | ICD-10-CM | POA: Diagnosis not present

## 2017-08-18 DIAGNOSIS — M25511 Pain in right shoulder: Secondary | ICD-10-CM | POA: Diagnosis not present

## 2017-09-29 DIAGNOSIS — S46011D Strain of muscle(s) and tendon(s) of the rotator cuff of right shoulder, subsequent encounter: Secondary | ICD-10-CM | POA: Diagnosis not present

## 2017-10-06 DIAGNOSIS — R42 Dizziness and giddiness: Secondary | ICD-10-CM | POA: Diagnosis not present

## 2017-10-06 DIAGNOSIS — Z79899 Other long term (current) drug therapy: Secondary | ICD-10-CM | POA: Diagnosis not present

## 2017-10-06 DIAGNOSIS — Z0000 Encounter for general adult medical examination without abnormal findings: Secondary | ICD-10-CM | POA: Diagnosis not present

## 2017-10-06 DIAGNOSIS — I10 Essential (primary) hypertension: Secondary | ICD-10-CM | POA: Diagnosis not present

## 2017-10-06 DIAGNOSIS — Z23 Encounter for immunization: Secondary | ICD-10-CM | POA: Diagnosis not present

## 2017-10-06 DIAGNOSIS — Z1389 Encounter for screening for other disorder: Secondary | ICD-10-CM | POA: Diagnosis not present

## 2017-10-09 DIAGNOSIS — M7540 Impingement syndrome of unspecified shoulder: Secondary | ICD-10-CM | POA: Diagnosis not present

## 2017-10-09 DIAGNOSIS — M75121 Complete rotator cuff tear or rupture of right shoulder, not specified as traumatic: Secondary | ICD-10-CM | POA: Diagnosis not present

## 2017-10-09 DIAGNOSIS — M24111 Other articular cartilage disorders, right shoulder: Secondary | ICD-10-CM | POA: Diagnosis not present

## 2017-10-09 DIAGNOSIS — M7541 Impingement syndrome of right shoulder: Secondary | ICD-10-CM | POA: Diagnosis not present

## 2017-10-09 DIAGNOSIS — G8918 Other acute postprocedural pain: Secondary | ICD-10-CM | POA: Diagnosis not present

## 2017-10-09 DIAGNOSIS — M66821 Spontaneous rupture of other tendons, right upper arm: Secondary | ICD-10-CM | POA: Diagnosis not present

## 2017-10-09 DIAGNOSIS — S46011A Strain of muscle(s) and tendon(s) of the rotator cuff of right shoulder, initial encounter: Secondary | ICD-10-CM | POA: Diagnosis not present

## 2017-10-24 DIAGNOSIS — M24111 Other articular cartilage disorders, right shoulder: Secondary | ICD-10-CM | POA: Diagnosis not present

## 2017-11-18 DIAGNOSIS — C61 Malignant neoplasm of prostate: Secondary | ICD-10-CM | POA: Diagnosis not present

## 2017-11-18 DIAGNOSIS — E559 Vitamin D deficiency, unspecified: Secondary | ICD-10-CM | POA: Diagnosis not present

## 2017-11-24 DIAGNOSIS — Z5111 Encounter for antineoplastic chemotherapy: Secondary | ICD-10-CM | POA: Diagnosis not present

## 2017-11-24 DIAGNOSIS — N393 Stress incontinence (female) (male): Secondary | ICD-10-CM | POA: Diagnosis not present

## 2017-11-24 DIAGNOSIS — R9721 Rising PSA following treatment for malignant neoplasm of prostate: Secondary | ICD-10-CM | POA: Diagnosis not present

## 2017-11-24 DIAGNOSIS — N5201 Erectile dysfunction due to arterial insufficiency: Secondary | ICD-10-CM | POA: Diagnosis not present

## 2017-11-24 DIAGNOSIS — C61 Malignant neoplasm of prostate: Secondary | ICD-10-CM | POA: Diagnosis not present

## 2017-11-24 DIAGNOSIS — S46011D Strain of muscle(s) and tendon(s) of the rotator cuff of right shoulder, subsequent encounter: Secondary | ICD-10-CM | POA: Diagnosis not present

## 2017-11-29 DIAGNOSIS — M25511 Pain in right shoulder: Secondary | ICD-10-CM | POA: Diagnosis not present

## 2017-11-29 DIAGNOSIS — M25611 Stiffness of right shoulder, not elsewhere classified: Secondary | ICD-10-CM | POA: Diagnosis not present

## 2017-11-29 DIAGNOSIS — Z9889 Other specified postprocedural states: Secondary | ICD-10-CM | POA: Diagnosis not present

## 2017-12-06 DIAGNOSIS — M25511 Pain in right shoulder: Secondary | ICD-10-CM | POA: Diagnosis not present

## 2017-12-11 DIAGNOSIS — R29898 Other symptoms and signs involving the musculoskeletal system: Secondary | ICD-10-CM | POA: Diagnosis not present

## 2017-12-11 DIAGNOSIS — Z9889 Other specified postprocedural states: Secondary | ICD-10-CM | POA: Diagnosis not present

## 2017-12-11 DIAGNOSIS — M25611 Stiffness of right shoulder, not elsewhere classified: Secondary | ICD-10-CM | POA: Diagnosis not present

## 2017-12-13 DIAGNOSIS — Z9889 Other specified postprocedural states: Secondary | ICD-10-CM | POA: Diagnosis not present

## 2017-12-13 DIAGNOSIS — R29898 Other symptoms and signs involving the musculoskeletal system: Secondary | ICD-10-CM | POA: Diagnosis not present

## 2017-12-13 DIAGNOSIS — M25611 Stiffness of right shoulder, not elsewhere classified: Secondary | ICD-10-CM | POA: Diagnosis not present

## 2017-12-20 DIAGNOSIS — R29898 Other symptoms and signs involving the musculoskeletal system: Secondary | ICD-10-CM | POA: Diagnosis not present

## 2017-12-20 DIAGNOSIS — Z9889 Other specified postprocedural states: Secondary | ICD-10-CM | POA: Diagnosis not present

## 2017-12-20 DIAGNOSIS — M25611 Stiffness of right shoulder, not elsewhere classified: Secondary | ICD-10-CM | POA: Diagnosis not present

## 2017-12-22 DIAGNOSIS — S46011D Strain of muscle(s) and tendon(s) of the rotator cuff of right shoulder, subsequent encounter: Secondary | ICD-10-CM | POA: Diagnosis not present

## 2017-12-25 DIAGNOSIS — M25611 Stiffness of right shoulder, not elsewhere classified: Secondary | ICD-10-CM | POA: Diagnosis not present

## 2017-12-25 DIAGNOSIS — Z9889 Other specified postprocedural states: Secondary | ICD-10-CM | POA: Diagnosis not present

## 2017-12-25 DIAGNOSIS — R29898 Other symptoms and signs involving the musculoskeletal system: Secondary | ICD-10-CM | POA: Diagnosis not present

## 2017-12-27 DIAGNOSIS — R29898 Other symptoms and signs involving the musculoskeletal system: Secondary | ICD-10-CM | POA: Diagnosis not present

## 2017-12-27 DIAGNOSIS — Z9889 Other specified postprocedural states: Secondary | ICD-10-CM | POA: Diagnosis not present

## 2017-12-27 DIAGNOSIS — M25611 Stiffness of right shoulder, not elsewhere classified: Secondary | ICD-10-CM | POA: Diagnosis not present

## 2018-01-01 DIAGNOSIS — M25611 Stiffness of right shoulder, not elsewhere classified: Secondary | ICD-10-CM | POA: Diagnosis not present

## 2018-01-01 DIAGNOSIS — M25511 Pain in right shoulder: Secondary | ICD-10-CM | POA: Diagnosis not present

## 2018-01-01 DIAGNOSIS — R29898 Other symptoms and signs involving the musculoskeletal system: Secondary | ICD-10-CM | POA: Diagnosis not present

## 2018-01-01 DIAGNOSIS — Z9889 Other specified postprocedural states: Secondary | ICD-10-CM | POA: Diagnosis not present

## 2018-01-10 DIAGNOSIS — M25611 Stiffness of right shoulder, not elsewhere classified: Secondary | ICD-10-CM | POA: Diagnosis not present

## 2018-01-17 DIAGNOSIS — M25511 Pain in right shoulder: Secondary | ICD-10-CM | POA: Diagnosis not present

## 2018-01-17 DIAGNOSIS — Z9889 Other specified postprocedural states: Secondary | ICD-10-CM | POA: Diagnosis not present

## 2018-01-17 DIAGNOSIS — M25611 Stiffness of right shoulder, not elsewhere classified: Secondary | ICD-10-CM | POA: Diagnosis not present

## 2018-01-17 DIAGNOSIS — R29898 Other symptoms and signs involving the musculoskeletal system: Secondary | ICD-10-CM | POA: Diagnosis not present

## 2018-01-31 DIAGNOSIS — M25611 Stiffness of right shoulder, not elsewhere classified: Secondary | ICD-10-CM | POA: Diagnosis not present

## 2018-02-26 DIAGNOSIS — E559 Vitamin D deficiency, unspecified: Secondary | ICD-10-CM | POA: Diagnosis not present

## 2018-02-26 DIAGNOSIS — R9721 Rising PSA following treatment for malignant neoplasm of prostate: Secondary | ICD-10-CM | POA: Diagnosis not present

## 2018-02-26 DIAGNOSIS — Z8546 Personal history of malignant neoplasm of prostate: Secondary | ICD-10-CM | POA: Diagnosis not present

## 2018-05-22 ENCOUNTER — Other Ambulatory Visit: Payer: Self-pay | Admitting: Geriatric Medicine

## 2018-05-22 DIAGNOSIS — J8410 Pulmonary fibrosis, unspecified: Secondary | ICD-10-CM

## 2018-05-22 DIAGNOSIS — Z8546 Personal history of malignant neoplasm of prostate: Secondary | ICD-10-CM | POA: Diagnosis not present

## 2018-05-22 DIAGNOSIS — I10 Essential (primary) hypertension: Secondary | ICD-10-CM | POA: Diagnosis not present

## 2018-05-22 DIAGNOSIS — R062 Wheezing: Secondary | ICD-10-CM | POA: Diagnosis not present

## 2018-05-28 ENCOUNTER — Ambulatory Visit
Admission: RE | Admit: 2018-05-28 | Discharge: 2018-05-28 | Disposition: A | Discharge status: Home / Self Care | Payer: Medicare Other | Source: Ambulatory Visit | Attending: Geriatric Medicine | Admitting: Geriatric Medicine

## 2018-05-28 DIAGNOSIS — J8410 Pulmonary fibrosis, unspecified: Secondary | ICD-10-CM

## 2018-05-28 DIAGNOSIS — Z5111 Encounter for antineoplastic chemotherapy: Secondary | ICD-10-CM | POA: Diagnosis not present

## 2018-05-28 DIAGNOSIS — J181 Lobar pneumonia, unspecified organism: Secondary | ICD-10-CM | POA: Diagnosis not present

## 2018-05-28 DIAGNOSIS — C61 Malignant neoplasm of prostate: Secondary | ICD-10-CM | POA: Diagnosis not present

## 2018-06-01 ENCOUNTER — Other Ambulatory Visit: Payer: Self-pay | Admitting: Urology

## 2018-06-01 DIAGNOSIS — R972 Elevated prostate specific antigen [PSA]: Secondary | ICD-10-CM

## 2018-06-12 ENCOUNTER — Ambulatory Visit
Admission: RE | Admit: 2018-06-12 | Discharge: 2018-06-12 | Disposition: A | Discharge status: Home / Self Care | Payer: Medicare Other | Source: Ambulatory Visit | Attending: Geriatric Medicine | Admitting: Geriatric Medicine

## 2018-06-12 ENCOUNTER — Other Ambulatory Visit: Payer: Self-pay | Admitting: Geriatric Medicine

## 2018-06-12 ENCOUNTER — Encounter (HOSPITAL_COMMUNITY): Payer: Medicare Other

## 2018-06-12 ENCOUNTER — Encounter (HOSPITAL_COMMUNITY)
Admission: RE | Admit: 2018-06-12 | Discharge: 2018-06-12 | Disposition: A | Discharge status: Home / Self Care | Payer: Medicare Other | Source: Ambulatory Visit | Attending: Urology | Admitting: Urology

## 2018-06-12 DIAGNOSIS — J189 Pneumonia, unspecified organism: Secondary | ICD-10-CM

## 2018-06-12 DIAGNOSIS — R972 Elevated prostate specific antigen [PSA]: Secondary | ICD-10-CM | POA: Diagnosis not present

## 2018-06-12 DIAGNOSIS — J181 Lobar pneumonia, unspecified organism: Secondary | ICD-10-CM | POA: Diagnosis not present

## 2018-06-12 DIAGNOSIS — C61 Malignant neoplasm of prostate: Secondary | ICD-10-CM | POA: Diagnosis not present

## 2018-06-12 DIAGNOSIS — Z8546 Personal history of malignant neoplasm of prostate: Secondary | ICD-10-CM | POA: Diagnosis not present

## 2018-06-12 MED ORDER — TECHNETIUM TC 99M MEDRONATE IV KIT
20.0000 | PACK | Freq: Once | INTRAVENOUS | Status: AC | PRN
  Administered 2018-06-12: 21 via INTRAVENOUS

## 2018-06-15 DIAGNOSIS — C61 Malignant neoplasm of prostate: Secondary | ICD-10-CM | POA: Diagnosis not present

## 2018-06-17 ENCOUNTER — Encounter: Payer: Self-pay | Admitting: Oncology

## 2018-06-17 ENCOUNTER — Telehealth: Payer: Self-pay | Admitting: Oncology

## 2018-06-17 NOTE — Telephone Encounter (Signed)
Spoke to the pt's wife to schedule David Warner to see Dr. Alen Blew on 7/25 at [place] to arrive 30 minutes early. Letter mailed.

## 2018-07-02 ENCOUNTER — Telehealth: Payer: Self-pay | Admitting: Oncology

## 2018-07-02 ENCOUNTER — Telehealth: Payer: Self-pay

## 2018-07-02 ENCOUNTER — Ambulatory Visit
Admission: RE | Admit: 2018-07-02 | Discharge: 2018-07-02 | Disposition: A | Discharge status: Home / Self Care | Payer: Medicare Other | Source: Ambulatory Visit | Attending: Geriatric Medicine | Admitting: Geriatric Medicine

## 2018-07-02 ENCOUNTER — Telehealth: Payer: Self-pay | Admitting: Pharmacist

## 2018-07-02 ENCOUNTER — Other Ambulatory Visit: Payer: Self-pay | Admitting: Geriatric Medicine

## 2018-07-02 ENCOUNTER — Encounter: Payer: Self-pay | Admitting: Medical Oncology

## 2018-07-02 ENCOUNTER — Inpatient Hospital Stay: Payer: Medicare Other | Attending: Oncology | Admitting: Oncology

## 2018-07-02 VITALS — BP 137/88 | HR 76 | Temp 98.5°F | Resp 17 | Ht 72.0 in | Wt 216.0 lb

## 2018-07-02 DIAGNOSIS — Z9079 Acquired absence of other genital organ(s): Secondary | ICD-10-CM | POA: Diagnosis not present

## 2018-07-02 DIAGNOSIS — C61 Malignant neoplasm of prostate: Secondary | ICD-10-CM | POA: Insufficient documentation

## 2018-07-02 DIAGNOSIS — Z79899 Other long term (current) drug therapy: Secondary | ICD-10-CM | POA: Diagnosis not present

## 2018-07-02 DIAGNOSIS — K219 Gastro-esophageal reflux disease without esophagitis: Secondary | ICD-10-CM | POA: Diagnosis not present

## 2018-07-02 DIAGNOSIS — R05 Cough: Secondary | ICD-10-CM | POA: Diagnosis not present

## 2018-07-02 DIAGNOSIS — Z87891 Personal history of nicotine dependence: Secondary | ICD-10-CM

## 2018-07-02 DIAGNOSIS — R32 Unspecified urinary incontinence: Secondary | ICD-10-CM

## 2018-07-02 DIAGNOSIS — C7951 Secondary malignant neoplasm of bone: Secondary | ICD-10-CM | POA: Insufficient documentation

## 2018-07-02 DIAGNOSIS — Z79818 Long term (current) use of other agents affecting estrogen receptors and estrogen levels: Secondary | ICD-10-CM | POA: Diagnosis not present

## 2018-07-02 DIAGNOSIS — Z923 Personal history of irradiation: Secondary | ICD-10-CM

## 2018-07-02 DIAGNOSIS — Z801 Family history of malignant neoplasm of trachea, bronchus and lung: Secondary | ICD-10-CM | POA: Diagnosis not present

## 2018-07-02 DIAGNOSIS — J189 Pneumonia, unspecified organism: Secondary | ICD-10-CM

## 2018-07-02 DIAGNOSIS — I10 Essential (primary) hypertension: Secondary | ICD-10-CM | POA: Diagnosis not present

## 2018-07-02 MED ORDER — ABIRATERONE ACETATE 250 MG PO TABS: 1000.0000 mg | ORAL_TABLET | Freq: Every day | ORAL | 0 refills | Status: DC

## 2018-07-02 MED ORDER — PREDNISONE 5 MG PO TABS: 5.0000 mg | ORAL_TABLET | Freq: Every day | ORAL | 3 refills | Status: AC

## 2018-07-02 NOTE — Progress Notes (Signed)
Introduced myself to Mr. Schreifels and his wife as the prostate nurse navigator and my role. He saw Dr. Alen Blew today and is very pleased with the news he received. He is being treated with Lupron and Dr. Alen Blew prescribed Zytiga. He was diagnosed in 2002 with prostate cancer. He under went a prostatectomy in 2002 and followed by radiation in 2007 due to rise in PSA. He has been on intermittent ADT due to rise in PSA. He recently had a CT of chest for respiratory symptoms and bone mets were revealed. He was very nervous and anxious before his visit today but is encouraged after talking with Dr. Alen Blew. He states he and  wife were married 3 months and has a lot of living to do. Jessie-oral chemo pharmacist came in to discuss the insurance coverage process and the drug in detail. I gave them my business care and asked them to call me with questions or concerns.

## 2018-07-02 NOTE — Telephone Encounter (Signed)
Oral Chemotherapy Pharmacist Encounter   I spoke with patient and wife in exam room for overview of: Zytiga.   Counseled patient on administration, dosing, side effects, monitoring, drug-food interactions, safe handling, storage, and disposal.  Patient will take Zytiga 250mg  tablets, 4 tablets (1000mg ) by mouth once daily on an empty stomach, 1 hour before or 2 hours after a meal.  Patient states he will take his Zyitga 1st thing in the morning and will wait at least 1 hour before eating.  Patient will take prednisone 5mg  tablet, 1 tablet by mouth one daily with breakfast.  Zytiga start date: TBD, pending medication acquisition  Adverse effects include but are not limited to: peripheral edema, GI upset, hypertension, hot flashes, fatigue, and arthralgias.    Prednisone prescription has been sent to [facility] on [address] in [place], [place]. Patient will obtain prednisone and knows to start prednisone on the same day as Zytiga start.  Reviewed with patient importance of keeping a medication schedule and plan for any missed doses.  Mr. and Mrs. Eastlick voiced understanding and appreciation.   All questions answered. Medication reconciliation performed and medication/allergy list updated.  Insurance authorization will be submitted. We will follow-up with patient once insurance authorization is approved and copayment is known, for medication dispensing. Oral Oncology Clinic will work with patient to obtain copayment assistance if needed.  Patient knows to call the office with questions or concerns.  Oral Oncology Clinic will continue to follow.  Thank you,  Johny Drilling, PharmD, BCPS, BCOP  07/02/2018 12:03 PM Oral Oncology Clinic [phone]

## 2018-07-02 NOTE — Telephone Encounter (Signed)
Per 7/25 los, called patient and left message with 9/5 appts.  Mailed calendar.

## 2018-07-02 NOTE — Progress Notes (Signed)
Reason for the request:    Prostate cancer  HPI: I was asked by Dr. Louis Meckel to evaluate Mr. David Warner for the diagnosis of prostate cancer.  He is a pleasant 72 year old man native of [place] currently resides in [place] after moving there recently.  We have lived in [place] for the majority of his life in [place].  He has a history of hypertension and diagnosis of prostate cancer that dates back to 2002.  At that time his PSA was elevated around 4.6 and underwent a radical prostatectomy followed by Dr. Serita Butcher.  At that time he had a Gleason score 4+3 equal 7 with 0/3 lymph node examined at that time showed prostate cancer.  He did develop recurrence in his prostate in 2007 and received salvage radiation therapy afterwards.  He developed biochemical relapse subsequent to that.  His staging work-up in 2009 showed no evidence of metastatic disease and subsequently was started on intermittent androgen deprivation initially under the care of Dr. Jasmine December and subsequently under the care of Dr. Louis Meckel.  In 2014 his PSA went to undetectable range after starting androgen deprivation.  He is PSA was up to 1.76 in 2015 and 6.5 subsequently 2016.  He received Lupron at that time and PSA went down to undetectable level.  In 2016 his PSA was up to 3.34 and received Lupron in 2017.  His PSA started to rise more rapidly as of late and his PSA was up to 11.7 in December 2018.  He received another Lupron in March 2019 and PSA was down to 1.21.  He received another Lupron in June 2019.  Staging work-up plated in July 2019 showed a bone scan obtained on 06/12/2018 which showed diffuse sclerotic lesion noted in right clavicle, first rib, fourth rib, fifth rib but no lymphadenopathy noted on CT scan of the abdomen.  He did have a CT scan of the chest due to complaints of wheezing and cough which showed no pulmonary metastasis but indicating the sclerotic lesions.  There is also some subtle changes in the lung that  could represent social lung disease and potentially pneumonia.  Clinically, he reports feeling reasonably well at this time.  He remains active and attends to activities of daily living.  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  He denies any bone pain or pathological fractures.  He does report some occasional cough and does report incontinence related to his prostate cancer surgery.  He does not report any headaches, blurry vision, syncope or seizures. Does not report any fevers, chills or sweats.  Does not report any  hemoptysis.  Does not report any chest pain, palpitation, orthopnea or leg edema.  Does not report any nausea, vomiting or abdominal pain.  Does not report any constipation or diarrhea.  Does not report any skeletal complaints.    Does not report frequency, urgency or hematuria.  Does not report any skin rashes or lesions. Does not report any heat or cold intolerance.  Does not report any lymphadenopathy or petechiae.  Does not report any anxiety or depression.  Remaining review of systems is negative.    Past Medical History:  Diagnosis Date  . Aortic sclerosis    7/14 echo  . Essential hypertension, benign    diagnosed 09/2004  . Former smoker    u/s aorta 9/12  . GERD (gastroesophageal reflux disease)   . Heart murmur   . History of CT scan    showed Chronic Lung scarring 9/10  .  History of umbilical hernia repair    3 done 11/1997  . 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 Prostate cancer    Dr. Jasmine December  . Ulcerative colitis 09/2006   Dr. Amedeo Plenty  :  :   Current Outpatient Medications:  .  calcium carbonate (TUMS - DOSED IN MG ELEMENTAL CALCIUM) 500 MG chewable tablet, Chew 1 tablet by mouth daily., Disp: , Rfl:  .  cholecalciferol (VITAMIN D) 1000 UNITS tablet, Take 1,000 Units by mouth daily., Disp: , Rfl:  .  Leuprolide Acetate (LUPRON DEPOT IM), Inject 5 mLs into the muscle as directed. 0.2 ml every 4 months, Disp: ,  Rfl:  .  lisinopril (PRINIVIL,ZESTRIL) 10 MG tablet, Take 10 mg by mouth daily., Disp: , Rfl: :  No Known Allergies:  Family History  Problem Relation Age of Onset  . Dementia Mother   . CVA Father   . Vascular Disease Father   . Lung cancer Sister   :  Social History   Socioeconomic History  . Marital status: Widowed    Spouse name: Not on file  . Number of children: Not on file  . Years of education: Not on file  . Highest education level: Not on file  Occupational History  . Not on file  Social Needs  . Financial resource strain: Not on file  . Food insecurity:    Worry: Not on file    Inability: Not on file  . Transportation needs:    Medical: Not on file    Non-medical: Not on file  Tobacco Use  . Smoking status: Former Smoker  Substance and Sexual Activity  . Alcohol use: Yes  . Drug use: No  . Sexual activity: Not on file  Lifestyle  . Physical activity:    Days per week: Not on file    Minutes per session: Not on file  . Stress: Not on file  Relationships  . Social connections:    Talks on phone: Not on file    Gets together: Not on file    Attends religious service: Not on file    Active member of club or organization: Not on file    Attends meetings of clubs or organizations: Not on file    Relationship status: Not on file  . Intimate partner violence:    Fear of current or ex partner: Not on file    Emotionally abused: Not on file    Physically abused: Not on file    Forced sexual activity: Not on file  Other Topics Concern  . Not on file  Social History Narrative  . Not on file  :  Pertinent items are noted in HPI.  Exam: Blood pressure 137/88, pulse 76, temperature 98.5 F (36.9 C), temperature source Oral, resp. rate 17, height 6' (1.829 m), weight 216 lb (98 kg), SpO2 94 %. ECOG 0  General appearance: alert and cooperative appeared without distress. Head: atraumatic without any abnormalities. Eyes: conjunctivae/corneas clear. PERRL.   Sclera anicteric. Throat: lips, mucosa, and tongue normal; without oral thrush or ulcers. Resp: clear to auscultation bilaterally without rhonchi, wheezes or dullness to percussion. Cardio: regular rate and rhythm, S1, S2 normal, no murmur, click, rub or gallop GI: soft, non-tender; bowel sounds normal; no masses,  no organomegaly Skin: Skin color, texture, turgor normal. No rashes or lesions Lymph nodes: Cervical, supraclavicular, and axillary nodes normal. Neurologic: Grossly normal without any motor, sensory or deep tendon reflexes. Musculoskeletal: No  joint deformity or effusion.  CBC    Component Value Date/Time   WBC 8.1 02/17/2008 1243   RBC 5.04 02/17/2008 1243   HGB 15.1 02/17/2008 1243   HGB 15.4 06/12/2006 0958   HCT 43.5 02/17/2008 1243   HCT 44.5 06/12/2006 0958   PLT 240 02/17/2008 1243   PLT 351 06/12/2006 0958   MCV 86.3 02/17/2008 1243   MCV 87.3 06/12/2006 0958   MCH 30.2 06/12/2006 0958   MCHC 34.7 02/17/2008 1243   RDW 14.1 02/17/2008 1243   RDW 13.7 06/12/2006 0958   LYMPHSABS 1.1 06/12/2006 0958   MONOABS 0.6 06/12/2006 0958   EOSABS 0.2 06/12/2006 0958   BASOSABS 0.0 06/12/2006 0958     Chemistry      Component Value Date/Time   NA 138 06/12/2006 0958   K 4.6 06/12/2006 0958   CL 102 06/12/2006 0958   CO2 27 06/12/2006 0958   BUN 11 06/12/2006 0958   CREATININE 0.98 09/29/2011 1138      Component Value Date/Time   CALCIUM 9.9 06/12/2006 0958   ALKPHOS 83 06/12/2006 0958   AST 17 06/12/2006 0958   ALT 14 06/12/2006 0958   BILITOT 0.3 06/12/2006 0958        Dg Chest 2 View  Result Date: 06/12/2018 CLINICAL DATA:  Recent pneumonia with persistent wheezing. History of prostate carcinoma EXAM: CHEST - 2 VIEW COMPARISON:  Chest radiograph November 19, 2013 and chest CT May 28, 2018 FINDINGS: There is persistent consolidation in the left lower lobe. More subtle areas of airspace consolidation are noted in portions of the right middle and lower  lobes, also seen on recent CT. No new parenchymal lung opacity evident. There are areas of sclerosis in several bony structures, unchanged from recent CT. Heart size and pulmonary vascularity are normal.  No adenopathy. IMPRESSION: Multifocal pneumonia with consolidation in the posterior left base as well as in portions of the right middle and lower lobes, also seen on recent prior CT examination. Sclerotic bony metastatic disease consistent with known prostate carcinoma. No adenopathy evident. Electronically Signed   By: Lowella Grip III M.D.   On: 06/12/2018 08:45   Nm Bone Scan Whole Body  Result Date: 06/12/2018 CLINICAL DATA: Prostate cancer. Rising PSA. EXAM: NUCLEAR MEDICINE WHOLE BODY BONE SCAN TECHNIQUE: Whole body anterior and posterior images were obtained approximately 3 hours after intravenous injection of radiopharmaceutical. RADIOPHARMACEUTICALS:  21.0 mCi Technetium-102m MDP IV COMPARISON:  Chest x-ray 06/12/2018. CT 07/28/2018. Bone scan 01/13/2008. FINDINGS: Bilateral renal function excretion. Increased activity noted about the right clavicle, left anterior first rib, right anterior fourth rib, right posterior fifth rib. Sclerotic lesions noted in these regions on prior CT. Given the patient's history of rising PSA and prostate cancer these findings suspicious for metastatic disease. IMPRESSION: Findings consistent with bony metastatic disease. Electronically Signed   By: Marcello Moores  Register   On: 06/12/2018 13:34    Assessment and Plan:    72 year old man with the following:  1.  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  His initial diagnosis of prostate cancer dates back to 2002 with a Gleason score 4+3 equal 7 and a PSA of around 4.  He is status post prostatectomy followed by salvage radiation therapy and has been receiving intermittent androgen deprivation therapy since 2014.  He has documented bony metastasis based on bone scan as well as CT scan done  in July 2019.  The natural course of this disease was discussed today with the patient  and his wife.  Treatment options were reviewed as well as prognosis long term outcomes.  He understands the backbone of treating advanced prostate cancer remains under deprivation.  I have recommended continuing Lupron indefinitely and continuously rather than intermittently.  Additional therapy in the form of Zytiga, Xtandi, systemic chemotherapy were also reviewed in this setting.  Complication associated with Zytiga and prednisone were discussed specifically.  Complication include nausea, fatigue, edema, hypokalemia, liver function abnormalities as well as adrenal insufficiency.  We also discussed the benefits associated with this treatment which include delaying further metastasis as well as improving overall survival.  We have discussed alternative options in the future that include systemic chemotherapy as well as radium treatment if he develops castration resistant disease.  After discussion today he is agreeable to proceed and will receive Zytiga 1000 mg daily with prednisone at 5 mg.  2.  Androgen deprivation: He is currently on Lupron which she will continue every 4 months.  They have expressed interest to receive that at the cancer center in which we will accommodate at that time.  His next Lupron anticipate will be in October 2019.  3.  Bone directed therapy: Risks and benefits of Delton See was discussed today.  He will obtain dental clearance and he has been taking calcium supplements.  Complications associated with this treatment including hypocalcemia as well as osteonecrosis of the jaw.  The benefit would be to decrease skeletal related events including bone pain and pathological fractures.  He is agreeable to proceed and will receive his first injection in September 2019.  4.  Prognosis and long-term outcomes: This was discussed today in detail.  He understands he has an incurable malignancy but disease that  can be palliated for an extended period of time.  His performance status is excellent and aggressive therapy is warranted.  5.  Follow-up: We will be in early September to follow his progress and assess toxicities as to [facility].  60  minutes was spent with the patient face-to-face today.  More than 50% of time was dedicated to patient counseling, education and discussing the natural course of this disease and treatment options including long-term outcomes and survival.    Thank you for the referral. A copy of this consult has been forwarded to the requesting physician.

## 2018-07-02 NOTE — Telephone Encounter (Signed)
Oral Oncology Patient Advocate Encounter  Received notification from [facility] that prior authorization for David Warner is required.  PA submitted on CoverMyMeds Key [id number] Status is pending  Oral Oncology Clinic will continue to follow.  [name] Patient [place] Phone [phone] Fax [phone]

## 2018-07-02 NOTE — Telephone Encounter (Signed)
Oral Oncology Pharmacist Encounter  Received new prescription for Zytiga (abiraterone) for the treatment of advanced, castration-sensitive prostate cancer in conjunction with prednisone and androgen deprivation therapy (Lupron), planned duration until disease progression or unacceptable toxicity.  No labs to assess, will be repeated at follow-up office visit Bps reviewed, WNL, will continue to be monitored  Current medication list in Epic reviewed, no DDIs with Zytiga identified.  Prescription has been e-scribed to the [facility] for benefits analysis and approval.  Oral Oncology Clinic will continue to follow for insurance authorization, copayment issues, initial counseling and start date.  Johny Drilling, PharmD, BCPS, BCOP  07/02/2018 12:03 PM Oral Oncology Clinic [phone]

## 2018-07-03 ENCOUNTER — Telehealth: Payer: Self-pay | Admitting: Oncology

## 2018-07-03 NOTE — Telephone Encounter (Signed)
Per 7/25 los.  Message with appts made for 9/5.

## 2018-07-03 NOTE — Telephone Encounter (Signed)
Oral Oncology Patient Advocate Encounter  Prior Authorization for David Warner has been approved.    PA# [id number] Effective dates: 07/03/18 through 12/30/18  Oral Oncology Clinic will continue to follow.   [place] Patient [place] Phone [phone] Fax [phone]

## 2018-07-06 MED FILL — ABIRATERONE ACETATE 250 MG: 250 | 30 days supply | Qty: 120 | Fill #0

## 2018-07-06 NOTE — Telephone Encounter (Signed)
Oral Oncology Patient Advocate Encounter  Fabio Asa copay is [phone]  I called patient to let him know the copay and that there is no financial assistance available at the moment.  He voiced understanding.   I assured him I would continue to look for financial assistance and let him know when it came available.  He voiced appreciation.  Zytiga was mailed 7-29 for delivery 7-30  [place] Patient [place] Phone [phone] Fax [phone]

## 2018-07-22 ENCOUNTER — Telehealth: Payer: Self-pay

## 2018-07-22 NOTE — Telephone Encounter (Signed)
Oral Oncology Patient Advocate Encounter  I was able to secure a grant from [employer] fund ([employer]) conditionally until patient receives paperwork and returns it to [employer].  I shared this grant info with [place] and it is as follows: BIN: [id number] PCN: [id number] Group: [id number] ID: [id number]  This will expire 12/08/18  I spoke to the patient to make him aware of the grant and the process.   He verbalized understanding and great appreciation.  [place] Patient [place] Phone [phone] Fax [phone]

## 2018-07-23 ENCOUNTER — Telehealth: Payer: Self-pay | Admitting: Oncology

## 2018-07-23 ENCOUNTER — Other Ambulatory Visit: Payer: Self-pay | Admitting: Geriatric Medicine

## 2018-07-23 ENCOUNTER — Ambulatory Visit
Admission: RE | Admit: 2018-07-23 | Discharge: 2018-07-23 | Disposition: A | Discharge status: Home / Self Care | Payer: Medicare Other | Source: Ambulatory Visit | Attending: Geriatric Medicine | Admitting: Geriatric Medicine

## 2018-07-23 DIAGNOSIS — R9389 Abnormal findings on diagnostic imaging of other specified body structures: Secondary | ICD-10-CM

## 2018-07-23 DIAGNOSIS — J181 Lobar pneumonia, unspecified organism: Secondary | ICD-10-CM | POA: Diagnosis not present

## 2018-07-23 NOTE — Telephone Encounter (Signed)
Faxed medical records to [facility] at [phone], Release ID: [id number]

## 2018-07-29 ENCOUNTER — Other Ambulatory Visit: Payer: Self-pay | Admitting: *Deleted

## 2018-07-29 DIAGNOSIS — C61 Malignant neoplasm of prostate: Secondary | ICD-10-CM

## 2018-07-29 MED ORDER — ABIRATERONE ACETATE 250 MG PO TABS
1000.0000 mg | ORAL_TABLET | Freq: Every day | ORAL | 0 refills | Status: DC
Start: 2018-07-29 — End: 2018-08-21

## 2018-08-03 MED FILL — ABIRATERONE ACETATE 250 MG: 250 | 30 days supply | Qty: 120 | Fill #0

## 2018-08-06 DIAGNOSIS — J8410 Pulmonary fibrosis, unspecified: Secondary | ICD-10-CM | POA: Diagnosis not present

## 2018-08-07 DIAGNOSIS — K219 Gastro-esophageal reflux disease without esophagitis: Secondary | ICD-10-CM | POA: Diagnosis not present

## 2018-08-07 DIAGNOSIS — C61 Malignant neoplasm of prostate: Secondary | ICD-10-CM | POA: Diagnosis not present

## 2018-08-07 DIAGNOSIS — M419 Scoliosis, unspecified: Secondary | ICD-10-CM | POA: Diagnosis not present

## 2018-08-07 DIAGNOSIS — J8410 Pulmonary fibrosis, unspecified: Secondary | ICD-10-CM | POA: Diagnosis not present

## 2018-08-07 DIAGNOSIS — I10 Essential (primary) hypertension: Secondary | ICD-10-CM | POA: Diagnosis not present

## 2018-08-07 DIAGNOSIS — R0902 Hypoxemia: Secondary | ICD-10-CM | POA: Diagnosis not present

## 2018-08-07 DIAGNOSIS — R911 Solitary pulmonary nodule: Secondary | ICD-10-CM | POA: Diagnosis not present

## 2018-08-07 DIAGNOSIS — Z8546 Personal history of malignant neoplasm of prostate: Secondary | ICD-10-CM | POA: Diagnosis not present

## 2018-08-07 DIAGNOSIS — J849 Interstitial pulmonary disease, unspecified: Secondary | ICD-10-CM | POA: Diagnosis not present

## 2018-08-07 DIAGNOSIS — R918 Other nonspecific abnormal finding of lung field: Secondary | ICD-10-CM | POA: Diagnosis not present

## 2018-08-12 DIAGNOSIS — R197 Diarrhea, unspecified: Secondary | ICD-10-CM | POA: Diagnosis not present

## 2018-08-12 DIAGNOSIS — J84116 Cryptogenic organizing pneumonia: Secondary | ICD-10-CM | POA: Diagnosis present

## 2018-08-12 DIAGNOSIS — J9601 Acute respiratory failure with hypoxia: Secondary | ICD-10-CM | POA: Diagnosis present

## 2018-08-12 DIAGNOSIS — I340 Nonrheumatic mitral (valve) insufficiency: Secondary | ICD-10-CM | POA: Diagnosis present

## 2018-08-12 DIAGNOSIS — J849 Interstitial pulmonary disease, unspecified: Secondary | ICD-10-CM | POA: Diagnosis not present

## 2018-08-12 DIAGNOSIS — Z9981 Dependence on supplemental oxygen: Secondary | ICD-10-CM | POA: Diagnosis not present

## 2018-08-12 DIAGNOSIS — R0902 Hypoxemia: Secondary | ICD-10-CM | POA: Diagnosis not present

## 2018-08-12 DIAGNOSIS — J690 Pneumonitis due to inhalation of food and vomit: Secondary | ICD-10-CM | POA: Diagnosis not present

## 2018-08-12 DIAGNOSIS — E873 Alkalosis: Secondary | ICD-10-CM | POA: Diagnosis not present

## 2018-08-12 DIAGNOSIS — Z87891 Personal history of nicotine dependence: Secondary | ICD-10-CM | POA: Diagnosis not present

## 2018-08-12 DIAGNOSIS — C7951 Secondary malignant neoplasm of bone: Secondary | ICD-10-CM | POA: Diagnosis present

## 2018-08-12 DIAGNOSIS — R0602 Shortness of breath: Secondary | ICD-10-CM | POA: Diagnosis not present

## 2018-08-12 DIAGNOSIS — R918 Other nonspecific abnormal finding of lung field: Secondary | ICD-10-CM | POA: Diagnosis not present

## 2018-08-12 DIAGNOSIS — J8489 Other specified interstitial pulmonary diseases: Secondary | ICD-10-CM | POA: Diagnosis present

## 2018-08-12 DIAGNOSIS — D72829 Elevated white blood cell count, unspecified: Secondary | ICD-10-CM | POA: Diagnosis not present

## 2018-08-12 DIAGNOSIS — R011 Cardiac murmur, unspecified: Secondary | ICD-10-CM | POA: Diagnosis present

## 2018-08-12 DIAGNOSIS — J309 Allergic rhinitis, unspecified: Secondary | ICD-10-CM | POA: Diagnosis present

## 2018-08-12 DIAGNOSIS — C61 Malignant neoplasm of prostate: Secondary | ICD-10-CM | POA: Diagnosis not present

## 2018-08-12 DIAGNOSIS — Z8546 Personal history of malignant neoplasm of prostate: Secondary | ICD-10-CM | POA: Diagnosis not present

## 2018-08-12 DIAGNOSIS — I341 Nonrheumatic mitral (valve) prolapse: Secondary | ICD-10-CM | POA: Diagnosis present

## 2018-08-12 DIAGNOSIS — I10 Essential (primary) hypertension: Secondary | ICD-10-CM | POA: Diagnosis present

## 2018-08-12 DIAGNOSIS — J9691 Respiratory failure, unspecified with hypoxia: Secondary | ICD-10-CM | POA: Diagnosis not present

## 2018-08-12 DIAGNOSIS — J984 Other disorders of lung: Secondary | ICD-10-CM | POA: Diagnosis not present

## 2018-08-12 DIAGNOSIS — J8410 Pulmonary fibrosis, unspecified: Secondary | ICD-10-CM | POA: Diagnosis present

## 2018-08-12 DIAGNOSIS — K589 Irritable bowel syndrome without diarrhea: Secondary | ICD-10-CM | POA: Diagnosis present

## 2018-08-12 DIAGNOSIS — R05 Cough: Secondary | ICD-10-CM | POA: Diagnosis not present

## 2018-08-13 ENCOUNTER — Inpatient Hospital Stay: Payer: Medicare Other

## 2018-08-13 ENCOUNTER — Inpatient Hospital Stay: Payer: Medicare Other | Admitting: Oncology

## 2018-08-13 ENCOUNTER — Other Ambulatory Visit: Payer: Self-pay | Admitting: *Deleted

## 2018-08-13 DIAGNOSIS — C61 Malignant neoplasm of prostate: Secondary | ICD-10-CM

## 2018-08-19 DIAGNOSIS — C61 Malignant neoplasm of prostate: Secondary | ICD-10-CM | POA: Diagnosis not present

## 2018-08-19 DIAGNOSIS — I059 Rheumatic mitral valve disease, unspecified: Secondary | ICD-10-CM | POA: Diagnosis not present

## 2018-08-19 DIAGNOSIS — Z923 Personal history of irradiation: Secondary | ICD-10-CM | POA: Diagnosis not present

## 2018-08-19 DIAGNOSIS — J189 Pneumonia, unspecified organism: Secondary | ICD-10-CM | POA: Diagnosis not present

## 2018-08-19 DIAGNOSIS — Z87891 Personal history of nicotine dependence: Secondary | ICD-10-CM | POA: Diagnosis not present

## 2018-08-19 DIAGNOSIS — J8410 Pulmonary fibrosis, unspecified: Secondary | ICD-10-CM | POA: Diagnosis not present

## 2018-08-19 DIAGNOSIS — Z9981 Dependence on supplemental oxygen: Secondary | ICD-10-CM | POA: Diagnosis not present

## 2018-08-19 DIAGNOSIS — J849 Interstitial pulmonary disease, unspecified: Secondary | ICD-10-CM | POA: Diagnosis not present

## 2018-08-19 DIAGNOSIS — I10 Essential (primary) hypertension: Secondary | ICD-10-CM | POA: Diagnosis not present

## 2018-08-19 DIAGNOSIS — J9601 Acute respiratory failure with hypoxia: Secondary | ICD-10-CM | POA: Diagnosis not present

## 2018-08-19 DIAGNOSIS — Z9079 Acquired absence of other genital organ(s): Secondary | ICD-10-CM | POA: Diagnosis not present

## 2018-08-20 ENCOUNTER — Inpatient Hospital Stay: Payer: Medicare Other | Attending: Oncology

## 2018-08-20 ENCOUNTER — Telehealth: Payer: Self-pay

## 2018-08-20 ENCOUNTER — Encounter: Payer: Self-pay | Admitting: *Deleted

## 2018-08-20 ENCOUNTER — Inpatient Hospital Stay (HOSPITAL_BASED_OUTPATIENT_CLINIC_OR_DEPARTMENT_OTHER): Payer: Medicare Other | Admitting: Oncology

## 2018-08-20 ENCOUNTER — Inpatient Hospital Stay: Payer: Medicare Other

## 2018-08-20 VITALS — BP 129/70 | HR 78 | Temp 98.4°F | Resp 17 | Ht 72.0 in | Wt 203.1 lb

## 2018-08-20 DIAGNOSIS — Z9981 Dependence on supplemental oxygen: Secondary | ICD-10-CM

## 2018-08-20 DIAGNOSIS — Z79818 Long term (current) use of other agents affecting estrogen receptors and estrogen levels: Secondary | ICD-10-CM | POA: Insufficient documentation

## 2018-08-20 DIAGNOSIS — Z9079 Acquired absence of other genital organ(s): Secondary | ICD-10-CM | POA: Insufficient documentation

## 2018-08-20 DIAGNOSIS — Z923 Personal history of irradiation: Secondary | ICD-10-CM | POA: Insufficient documentation

## 2018-08-20 DIAGNOSIS — C7951 Secondary malignant neoplasm of bone: Secondary | ICD-10-CM

## 2018-08-20 DIAGNOSIS — R0902 Hypoxemia: Secondary | ICD-10-CM | POA: Diagnosis not present

## 2018-08-20 DIAGNOSIS — C61 Malignant neoplasm of prostate: Secondary | ICD-10-CM

## 2018-08-20 DIAGNOSIS — Z79899 Other long term (current) drug therapy: Secondary | ICD-10-CM

## 2018-08-20 DIAGNOSIS — Z7952 Long term (current) use of systemic steroids: Secondary | ICD-10-CM | POA: Insufficient documentation

## 2018-08-20 MED ORDER — DENOSUMAB 120 MG/1.7ML ~~LOC~~ SOLN
SUBCUTANEOUS | Status: AC
  Filled 2018-08-20: qty 1.7

## 2018-08-20 MED ORDER — DENOSUMAB 120 MG/1.7ML ~~LOC~~ SOLN
120.0000 mg | Freq: Once | SUBCUTANEOUS | Status: AC
  Administered 2018-08-20: 120 mg via SUBCUTANEOUS

## 2018-08-20 NOTE — Telephone Encounter (Signed)
Printed avs and calender of upcoming appointment. Per 9/12 los

## 2018-08-20 NOTE — Progress Notes (Signed)
Hematology and Oncology Follow Up Visit  Dreshon Proffit [id number] [birth year] 72 y.o. 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  He was initially diagnosed with a Gleason score 4+3 equal 7 and a PSA of 4 and 2002.   Prior Therapy:  He is status post prostatectomy in 2002 followed by salvage radiation therapy and has been receiving intermittent androgen deprivation therapy since 2014.    He developed bony metastasis based on bone scan as well as CT scan done in July 2019.  Current therapy:  Lupron 30 mg every 4 months.  Zytiga 1000 mg daily with prednisone started in July 2019.  Interim History: Mr. Odonoghue presents today for a follow-up.  Since the last visit, he started taking Zytiga and prednisone and took it for 31 days.  He was hospitalized at [facility] for respiratory failure and hypoxia.  He underwent a bronchoscopy and suspecting organizing pneumonia due to inflammation without any infectious etiology.  He was discharged recently on oxygen and tapering doses of prednisone.  He was discharged on 60 mg initially.  He is respiratory status is improving although he is still quite hypoxic and requires about 5 L of oxygen.  His mobility is improving slowly with physical therapy at this time.  Appetite is also improving after the loss of 13 pounds.  He denies any specific complications related to [facility] after he has taken it for close to 38 days.  He denies any excessive fatigue or tiredness but does report occasional headaches.  He denies any arthralgias or myalgias.  He does not report any headaches, blurry vision, syncope or seizures. Does not report any fevers, chills or sweats.  Does not report any  hemoptysis.  Does not report any chest pain, palpitation, orthopnea or leg edema.    He does report dyspnea on exertion.  Does not  report any nausea, vomiting or abdominal pain.  Does not report any constipation or diarrhea.  Does not report any skeletal complaints.    Does not report frequency, urgency or hematuria.  Does not report any skin rashes or lesions. Does not report any heat or cold intolerance.  Does not report any lymphadenopathy or petechiae.  Does not report any anxiety or depression.  Remaining review of systems is negative.    Medications: I have reviewed the patient's current medications.  Current Outpatient Medications  Medication Sig Dispense Refill  . abiraterone acetate (ZYTIGA) 250 MG tablet Take 4 tablets (1,000 mg total) by mouth daily. Take on an empty stomach 1 hour before or 2 hours after a meal 120 tablet 0  . calcium carbonate (TUMS - DOSED IN MG ELEMENTAL CALCIUM) 500 MG chewable tablet Chew 1 tablet by mouth daily.    . cholecalciferol (VITAMIN D) 1000 UNITS tablet Take 1,000 Units by mouth daily.    Marland Kitchen leuprolide (LUPRON) 1 MG/0.2ML injection Inject 1 mg into the skin every 4 (four) months.    Marland Kitchen lisinopril (PRINIVIL,ZESTRIL) 10 MG tablet Take 10 mg by mouth daily.    . predniSONE (DELTASONE) 5 MG tablet Take 1 tablet (5 mg total) by mouth daily with breakfast. 90 tablet 3   No current facility-administered medications for this visit.      Allergies: No Known Allergies  Past Medical History, Surgical history, Social history, and Family History were reviewed and updated.    Physical Exam: Blood pressure 129/70, pulse  78, temperature 98.4 F (36.9 C), temperature source Oral, resp. rate 17, height 6' (1.829 m), weight 203 lb 1.6 oz (92.1 kg), SpO2 90 %.   ECOG: 1 General appearance: alert and cooperative appeared without distress with oxygen nasal cannula. Head: Normocephalic, without obvious abnormality Oropharynx: No oral thrush or ulcers. Eyes: No scleral icterus.  Pupils are equal and round reactive to light. Lymph nodes: Cervical, supraclavicular, and axillary nodes  normal. Heart:regular rate and rhythm, S1, S2 normal, no murmur, click, rub or gallop Lung: Rales auscultated bilaterally without any wheezes.  Adequate air auscultated both lungs. Abdomin: soft, non-tender, without masses or organomegaly. Neurological: No motor, sensory deficits.  Intact deep tendon reflexes. Skin: No rashes or lesions.  No ecchymosis or petechiae. Musculoskeletal: No joint deformity or effusion.     Lab Results: Lab Results  Component Value Date   WBC 8.1 02/17/2008   HGB 15.1 02/17/2008   HCT 43.5 02/17/2008   MCV 86.3 02/17/2008   PLT 240 02/17/2008     Chemistry      Component Value Date/Time   NA 138 06/12/2006 0958   K 4.6 06/12/2006 0958   CL 102 06/12/2006 0958   CO2 27 06/12/2006 0958   BUN 11 06/12/2006 0958   CREATININE 0.98 09/29/2011 1138      Component Value Date/Time   CALCIUM 9.9 06/12/2006 0958   ALKPHOS 83 06/12/2006 0958   AST 17 06/12/2006 0958   ALT 14 06/12/2006 0958   BILITOT 0.3 06/12/2006 0958         Impression and Plan:  72 year old man with the following:  1.  Advanced prostate cancer that is currently castration-sensitive documented in June 2019.  His PSA was up to 11.14 November 2017 and was down to 1.21 in March 2019.  He was started on Zytiga and prednisone in addition to androgen deprivation in July 2019.  He has taken this medication for over a month without any complications.  Risks and benefits of continuing this treatment long-term as well as alternative options were reviewed again.  These options will include systemic chemotherapy versus Xtandi among others.  His PSA is currently pending at this time.  He is potassium and liver function tests all within normal range obtained on 08/18/2018.  After discussion today he is agreeable to continue at this time.  2.  Androgen deprivation: He remains on Lupron with his next injection will be in October 2019.  Risks and benefits of continuing this treatment moving forward  was reviewed today is agreeable to continue.  3.  Bone directed therapy: He is a good candidate for Xgeva given his high risk of developing skeletal related events.  Long-term complication associated with this therapy was reviewed today which include arthralgias, myalgias and osteonecrosis of the jaw in addition to hypocalcemia.  He is agreeable to proceed today.  His calcium obtained on 08/18/2018 was within normal range at 10.0.  His creatinine was 0.81  4.  Prognosis and goals of care: Treatment is palliative at this time although his performance status is excellent and aggressive therapy is warranted.  5.  Follow-up: We will be in 6 weeks to follow his progress.  He is interested in getting his care at [facility] for convenience purposes and will assist in establishing care in the interim.  He will cancel his appointment in October if this materializes before the next visit.  25  minutes was spent with the patient face-to-face today.  More than 50% of time was dedicated  to reviewing the natural course of this disease, discussing treatment options and coordinating his plan of care.      Zola Button, MD 9/12/20191:11 PM

## 2018-08-20 NOTE — Patient Instructions (Signed)

## 2018-08-20 NOTE — Progress Notes (Signed)
Pt. Had CMP done at outside lab on 08/07/18 calcium was 9.8 per Dr. Alen Blew these results are ok to give xgeva today. Pt. Tolerated injection well,No further problems or concerns noted.

## 2018-08-21 ENCOUNTER — Other Ambulatory Visit: Payer: Self-pay | Admitting: Oncology

## 2018-08-21 ENCOUNTER — Telehealth: Payer: Self-pay | Admitting: *Deleted

## 2018-08-21 DIAGNOSIS — C61 Malignant neoplasm of prostate: Secondary | ICD-10-CM

## 2018-08-21 LAB — PROSTATE-SPECIFIC AG, SERUM (LABCORP): Prostate Specific Ag, Serum: 0.2 ng/mL (ref 0.0–4.0)

## 2018-08-21 NOTE — Telephone Encounter (Signed)
-----   Message from Wyatt Portela, MD sent at 08/21/2018  8:12 AM EDT ----- Please let him know his PSA is very low

## 2018-08-21 NOTE — Telephone Encounter (Signed)
Spoke with patient, gave results of last PSA 

## 2018-08-27 ENCOUNTER — Telehealth: Payer: Self-pay | Admitting: Oncology

## 2018-08-27 MED FILL — ABIRATERONE ACETATE 250 MG: 250 | 30 days supply | Qty: 120 | Fill #0

## 2018-08-27 NOTE — Telephone Encounter (Signed)
FAXED RECORDS TO [facility] RELEASE ID [id number]

## 2018-08-28 DIAGNOSIS — J8410 Pulmonary fibrosis, unspecified: Secondary | ICD-10-CM | POA: Diagnosis not present

## 2018-08-28 DIAGNOSIS — I059 Rheumatic mitral valve disease, unspecified: Secondary | ICD-10-CM | POA: Diagnosis not present

## 2018-08-28 DIAGNOSIS — J849 Interstitial pulmonary disease, unspecified: Secondary | ICD-10-CM | POA: Diagnosis not present

## 2018-08-28 DIAGNOSIS — C61 Malignant neoplasm of prostate: Secondary | ICD-10-CM | POA: Diagnosis not present

## 2018-08-28 DIAGNOSIS — J9601 Acute respiratory failure with hypoxia: Secondary | ICD-10-CM | POA: Diagnosis not present

## 2018-08-28 DIAGNOSIS — J189 Pneumonia, unspecified organism: Secondary | ICD-10-CM | POA: Diagnosis not present

## 2018-09-04 DIAGNOSIS — J9601 Acute respiratory failure with hypoxia: Secondary | ICD-10-CM | POA: Diagnosis not present

## 2018-09-04 DIAGNOSIS — J8410 Pulmonary fibrosis, unspecified: Secondary | ICD-10-CM | POA: Diagnosis not present

## 2018-09-04 DIAGNOSIS — J849 Interstitial pulmonary disease, unspecified: Secondary | ICD-10-CM | POA: Diagnosis not present

## 2018-09-04 DIAGNOSIS — I059 Rheumatic mitral valve disease, unspecified: Secondary | ICD-10-CM | POA: Diagnosis not present

## 2018-09-04 DIAGNOSIS — C61 Malignant neoplasm of prostate: Secondary | ICD-10-CM | POA: Diagnosis not present

## 2018-09-04 DIAGNOSIS — J189 Pneumonia, unspecified organism: Secondary | ICD-10-CM | POA: Diagnosis not present

## 2018-09-07 DIAGNOSIS — J84116 Cryptogenic organizing pneumonia: Secondary | ICD-10-CM | POA: Diagnosis not present

## 2018-09-07 DIAGNOSIS — C61 Malignant neoplasm of prostate: Secondary | ICD-10-CM | POA: Diagnosis not present

## 2018-09-07 DIAGNOSIS — I341 Nonrheumatic mitral (valve) prolapse: Secondary | ICD-10-CM | POA: Diagnosis not present

## 2018-09-07 DIAGNOSIS — R0902 Hypoxemia: Secondary | ICD-10-CM | POA: Diagnosis not present

## 2018-09-07 DIAGNOSIS — C3492 Malignant neoplasm of unspecified part of left bronchus or lung: Secondary | ICD-10-CM | POA: Diagnosis not present

## 2018-09-07 DIAGNOSIS — R918 Other nonspecific abnormal finding of lung field: Secondary | ICD-10-CM | POA: Diagnosis not present

## 2018-09-07 DIAGNOSIS — J9601 Acute respiratory failure with hypoxia: Secondary | ICD-10-CM | POA: Diagnosis not present

## 2018-09-07 DIAGNOSIS — J9621 Acute and chronic respiratory failure with hypoxia: Secondary | ICD-10-CM | POA: Diagnosis not present

## 2018-09-07 DIAGNOSIS — C7951 Secondary malignant neoplasm of bone: Secondary | ICD-10-CM | POA: Diagnosis not present

## 2018-09-08 DIAGNOSIS — J8410 Pulmonary fibrosis, unspecified: Secondary | ICD-10-CM | POA: Diagnosis not present

## 2018-09-08 DIAGNOSIS — R509 Fever, unspecified: Secondary | ICD-10-CM | POA: Diagnosis not present

## 2018-09-08 DIAGNOSIS — J9621 Acute and chronic respiratory failure with hypoxia: Secondary | ICD-10-CM | POA: Diagnosis present

## 2018-09-08 DIAGNOSIS — Z9079 Acquired absence of other genital organ(s): Secondary | ICD-10-CM | POA: Diagnosis not present

## 2018-09-08 DIAGNOSIS — Z87891 Personal history of nicotine dependence: Secondary | ICD-10-CM | POA: Diagnosis not present

## 2018-09-08 DIAGNOSIS — J188 Other pneumonia, unspecified organism: Secondary | ICD-10-CM | POA: Diagnosis not present

## 2018-09-08 DIAGNOSIS — K5903 Drug induced constipation: Secondary | ICD-10-CM | POA: Diagnosis not present

## 2018-09-08 DIAGNOSIS — R0609 Other forms of dyspnea: Secondary | ICD-10-CM | POA: Diagnosis not present

## 2018-09-08 DIAGNOSIS — Z7952 Long term (current) use of systemic steroids: Secondary | ICD-10-CM | POA: Diagnosis not present

## 2018-09-08 DIAGNOSIS — F419 Anxiety disorder, unspecified: Secondary | ICD-10-CM | POA: Diagnosis present

## 2018-09-08 DIAGNOSIS — R0602 Shortness of breath: Secondary | ICD-10-CM | POA: Diagnosis not present

## 2018-09-08 DIAGNOSIS — C7951 Secondary malignant neoplasm of bone: Secondary | ICD-10-CM | POA: Diagnosis present

## 2018-09-08 DIAGNOSIS — Z9989 Dependence on other enabling machines and devices: Secondary | ICD-10-CM | POA: Diagnosis not present

## 2018-09-08 DIAGNOSIS — J849 Interstitial pulmonary disease, unspecified: Secondary | ICD-10-CM | POA: Diagnosis not present

## 2018-09-08 DIAGNOSIS — Z9221 Personal history of antineoplastic chemotherapy: Secondary | ICD-10-CM | POA: Diagnosis not present

## 2018-09-08 DIAGNOSIS — J939 Pneumothorax, unspecified: Secondary | ICD-10-CM | POA: Diagnosis not present

## 2018-09-08 DIAGNOSIS — C3490 Malignant neoplasm of unspecified part of unspecified bronchus or lung: Secondary | ICD-10-CM | POA: Diagnosis not present

## 2018-09-08 DIAGNOSIS — R451 Restlessness and agitation: Secondary | ICD-10-CM | POA: Diagnosis not present

## 2018-09-08 DIAGNOSIS — J942 Hemothorax: Secondary | ICD-10-CM | POA: Diagnosis not present

## 2018-09-08 DIAGNOSIS — G4700 Insomnia, unspecified: Secondary | ICD-10-CM | POA: Diagnosis not present

## 2018-09-08 DIAGNOSIS — J9601 Acute respiratory failure with hypoxia: Secondary | ICD-10-CM | POA: Diagnosis not present

## 2018-09-08 DIAGNOSIS — R05 Cough: Secondary | ICD-10-CM | POA: Diagnosis not present

## 2018-09-08 DIAGNOSIS — C61 Malignant neoplasm of prostate: Secondary | ICD-10-CM | POA: Diagnosis present

## 2018-09-08 DIAGNOSIS — I517 Cardiomegaly: Secondary | ICD-10-CM | POA: Diagnosis not present

## 2018-09-08 DIAGNOSIS — R0902 Hypoxemia: Secondary | ICD-10-CM | POA: Diagnosis not present

## 2018-09-08 DIAGNOSIS — J95811 Postprocedural pneumothorax: Secondary | ICD-10-CM | POA: Diagnosis not present

## 2018-09-08 DIAGNOSIS — Z515 Encounter for palliative care: Secondary | ICD-10-CM | POA: Diagnosis not present

## 2018-09-08 DIAGNOSIS — Z66 Do not resuscitate: Secondary | ICD-10-CM | POA: Diagnosis not present

## 2018-09-08 DIAGNOSIS — C3492 Malignant neoplasm of unspecified part of left bronchus or lung: Secondary | ICD-10-CM | POA: Diagnosis present

## 2018-09-08 DIAGNOSIS — Z5111 Encounter for antineoplastic chemotherapy: Secondary | ICD-10-CM | POA: Diagnosis not present

## 2018-09-08 DIAGNOSIS — I341 Nonrheumatic mitral (valve) prolapse: Secondary | ICD-10-CM | POA: Diagnosis present

## 2018-09-08 DIAGNOSIS — K5730 Diverticulosis of large intestine without perforation or abscess without bleeding: Secondary | ICD-10-CM | POA: Diagnosis not present

## 2018-09-08 DIAGNOSIS — J84116 Cryptogenic organizing pneumonia: Secondary | ICD-10-CM | POA: Diagnosis present

## 2018-09-08 DIAGNOSIS — R0600 Dyspnea, unspecified: Secondary | ICD-10-CM | POA: Diagnosis not present

## 2018-09-08 DIAGNOSIS — D72829 Elevated white blood cell count, unspecified: Secondary | ICD-10-CM | POA: Diagnosis not present

## 2018-09-08 DIAGNOSIS — I10 Essential (primary) hypertension: Secondary | ICD-10-CM | POA: Diagnosis present

## 2018-09-08 DIAGNOSIS — R918 Other nonspecific abnormal finding of lung field: Secondary | ICD-10-CM | POA: Diagnosis not present

## 2018-09-08 DIAGNOSIS — N281 Cyst of kidney, acquired: Secondary | ICD-10-CM | POA: Diagnosis not present

## 2018-09-08 DIAGNOSIS — R5383 Other fatigue: Secondary | ICD-10-CM | POA: Diagnosis not present

## 2018-09-08 DIAGNOSIS — Z48813 Encounter for surgical aftercare following surgery on the respiratory system: Secondary | ICD-10-CM | POA: Diagnosis not present

## 2018-09-08 DIAGNOSIS — R935 Abnormal findings on diagnostic imaging of other abdominal regions, including retroperitoneum: Secondary | ICD-10-CM | POA: Diagnosis not present

## 2018-09-08 DIAGNOSIS — R7303 Prediabetes: Secondary | ICD-10-CM | POA: Diagnosis present

## 2018-09-08 DIAGNOSIS — Z9981 Dependence on supplemental oxygen: Secondary | ICD-10-CM | POA: Diagnosis not present

## 2018-09-08 DIAGNOSIS — J189 Pneumonia, unspecified organism: Secondary | ICD-10-CM | POA: Diagnosis not present

## 2018-09-08 DIAGNOSIS — C7800 Secondary malignant neoplasm of unspecified lung: Secondary | ICD-10-CM | POA: Diagnosis not present

## 2018-09-21 ENCOUNTER — Other Ambulatory Visit: Payer: Self-pay | Admitting: Oncology

## 2018-09-21 DIAGNOSIS — C61 Malignant neoplasm of prostate: Secondary | ICD-10-CM

## 2018-09-22 ENCOUNTER — Other Ambulatory Visit: Payer: Self-pay | Admitting: Oncology

## 2018-09-22 ENCOUNTER — Telehealth: Payer: Self-pay

## 2018-09-22 DIAGNOSIS — C61 Malignant neoplasm of prostate: Secondary | ICD-10-CM

## 2018-09-22 NOTE — Telephone Encounter (Signed)
Received a call from the patient and spouse requesting 1 month refill of the Zytiga as the patient has had difficulty establishing care in [facility] due to hospitalization. Spoke with Dr. Alen Blew, refill sent to pharmacy, and patient/spouse made aware.

## 2018-09-23 MED FILL — ABIRATERONE ACETATE 250 MG: 250 | 30 days supply | Qty: 120 | Fill #0

## 2018-09-30 ENCOUNTER — Telehealth: Payer: Self-pay | Admitting: Oncology

## 2018-09-30 NOTE — Telephone Encounter (Signed)
Patients wife called to cancel

## 2018-10-02 ENCOUNTER — Inpatient Hospital Stay: Payer: Medicare Other

## 2018-10-02 ENCOUNTER — Inpatient Hospital Stay: Payer: Medicare Other | Admitting: Oncology

## 2018-10-28 ENCOUNTER — Telehealth: Payer: Self-pay

## 2018-10-28 NOTE — Telephone Encounter (Signed)
Oral Oncology Patient Advocate Encounter  The call center was on call attempt 3 in trying to get in touch with the patient to refill his Zytiga. The patients voicemail was full. I called his wife and she informed me that Mr. Paskett passed away 2 weeks ago. I expressed my condolences and she thanked me for letting you all know.  [place] Patient [place] Phone [phone] Fax [phone]

## 2018-11-08 DEATH — deceased

## 2019-06-07 IMAGING — NM NM BONE WHOLE BODY
2 series · 2 of 2 positions shown · non-contrast
Comparison: Chest x-ray 06/12/2018. CT 07/28/2018. Bone scan
01/13/2008.

CLINICAL DATA: Prostate cancer. Rising PSA.

EXAM:
NUCLEAR MEDICINE WHOLE BODY BONE SCAN
TECHNIQUE: Whole body anterior and posterior images were obtained approximately
3 hours after intravenous injection of radiopharmaceutical.
RADIOPHARMACEUTICALS:  21.0 mCi 1echnetium-XXm MDP IV

[Series 1: wbr_bone_40 whole body · 2.66mm/px · 1 of 1 slices shown (1 of 2)]
[im 1/1]
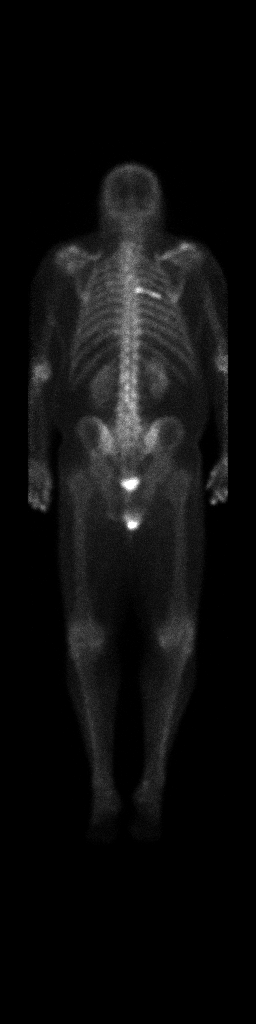

[Series 1: wbr_bone_40 whole body · 2.66mm/px · 1 of 1 slices shown (2 of 2)]
[im 1/1]
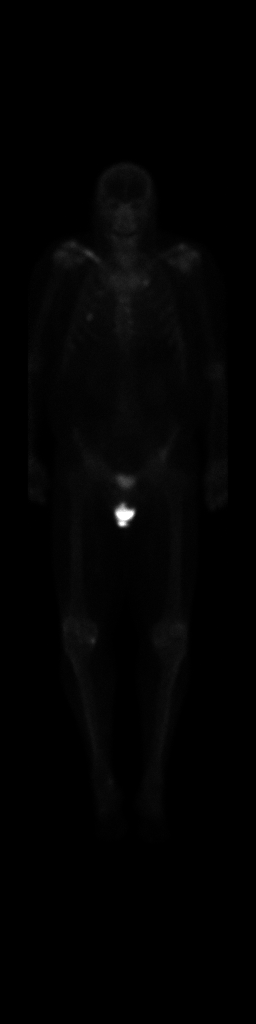

[2 of 2 positions shown; findings below may reference images not displayed]

FINDINGS: Bilateral renal function excretion. Increased activity noted about
the right clavicle, left anterior first rib, right anterior fourth
rib, right posterior fifth rib. Sclerotic lesions noted in these
regions on prior CT. Given the patient's history of rising PSA and
prostate cancer these findings suspicious for metastatic disease.
IMPRESSION: Findings consistent with bony metastatic disease.

## 2019-06-07 IMAGING — CR DG CHEST 2V
2 series · 2 of 2 positions shown · non-contrast
Comparison: Chest radiograph November 19, 2013 and chest CT May 28, 2018

CLINICAL DATA: Recent pneumonia with persistent wheezing. History
of prostate carcinoma

EXAM:
CHEST - 2 VIEW

[w chest pa]
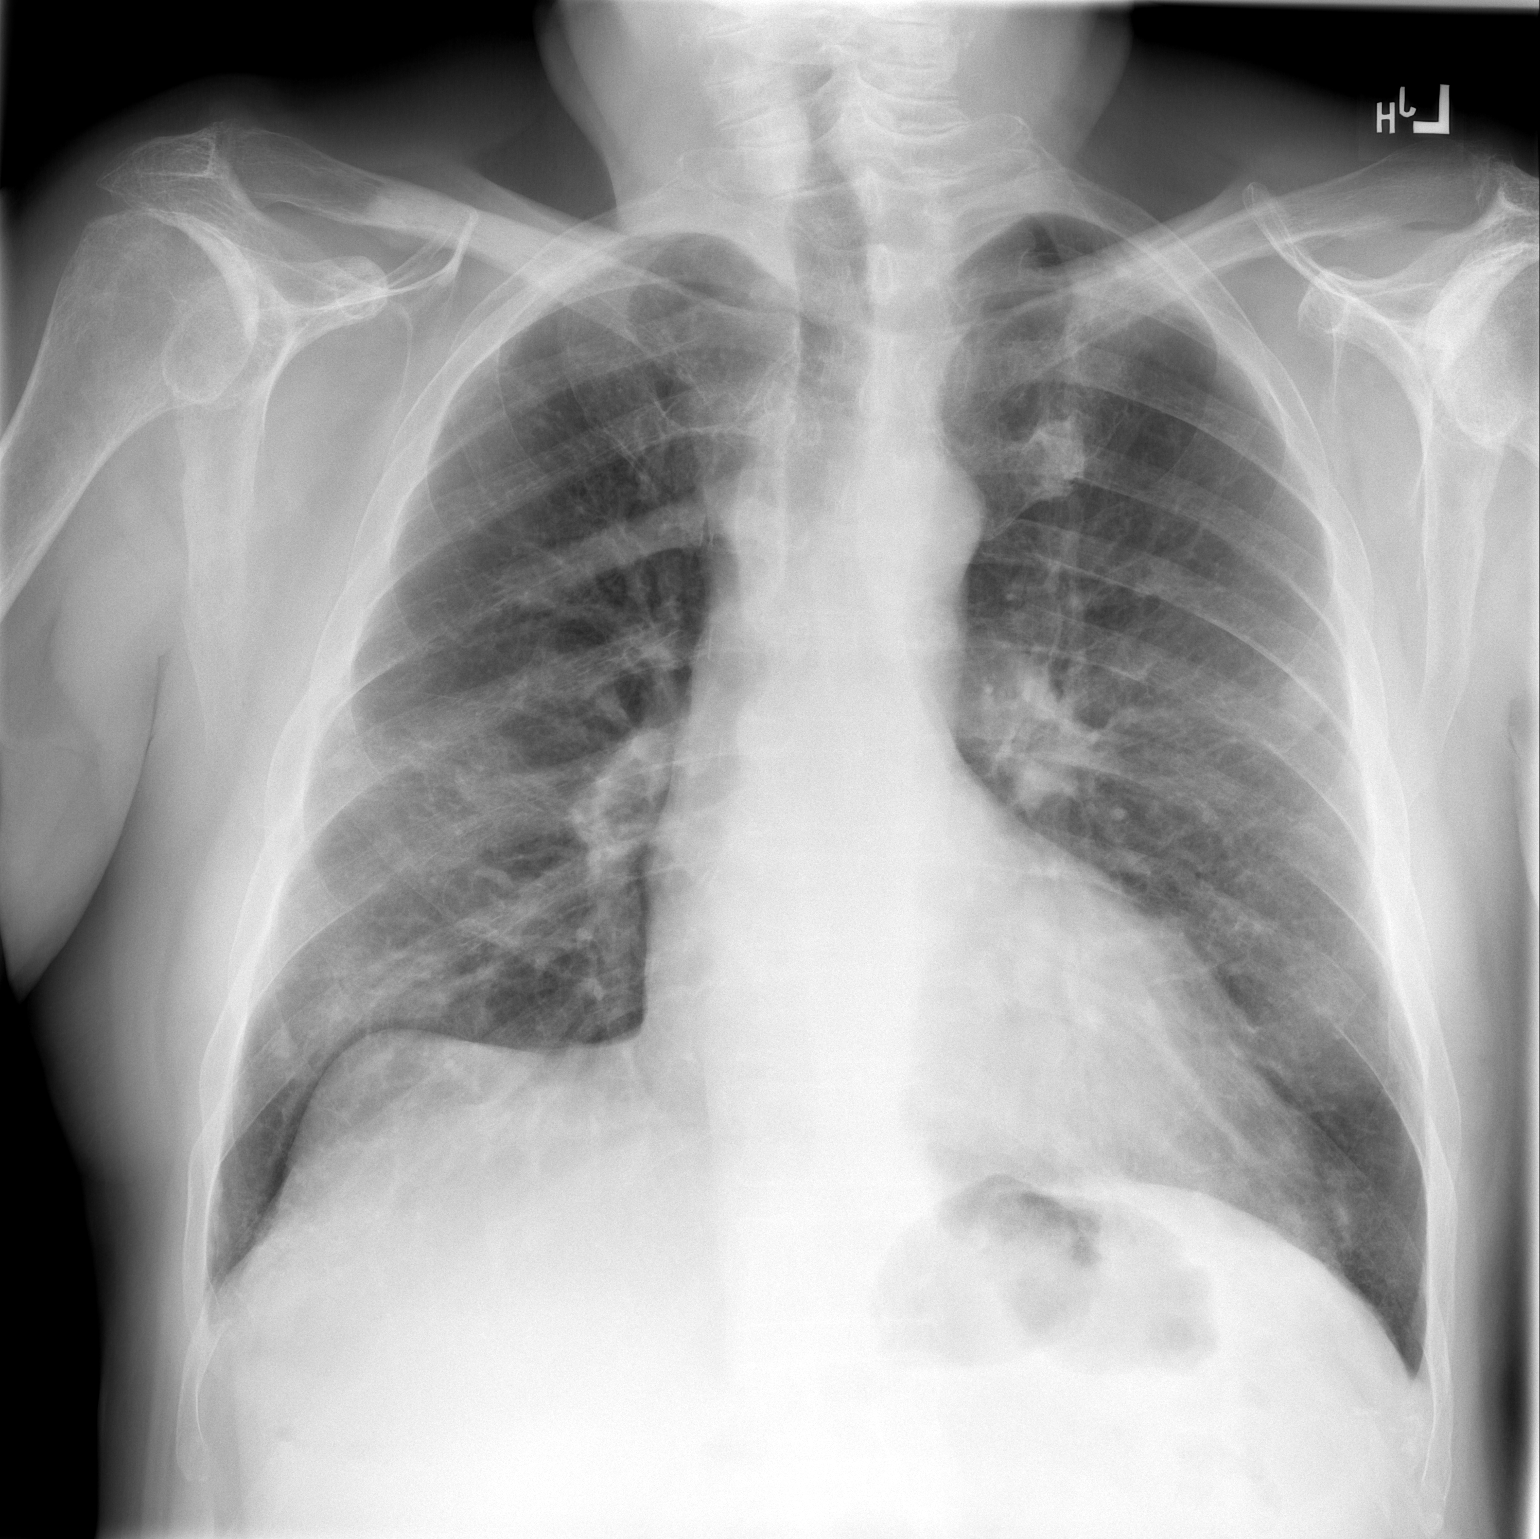

[w chest lat]
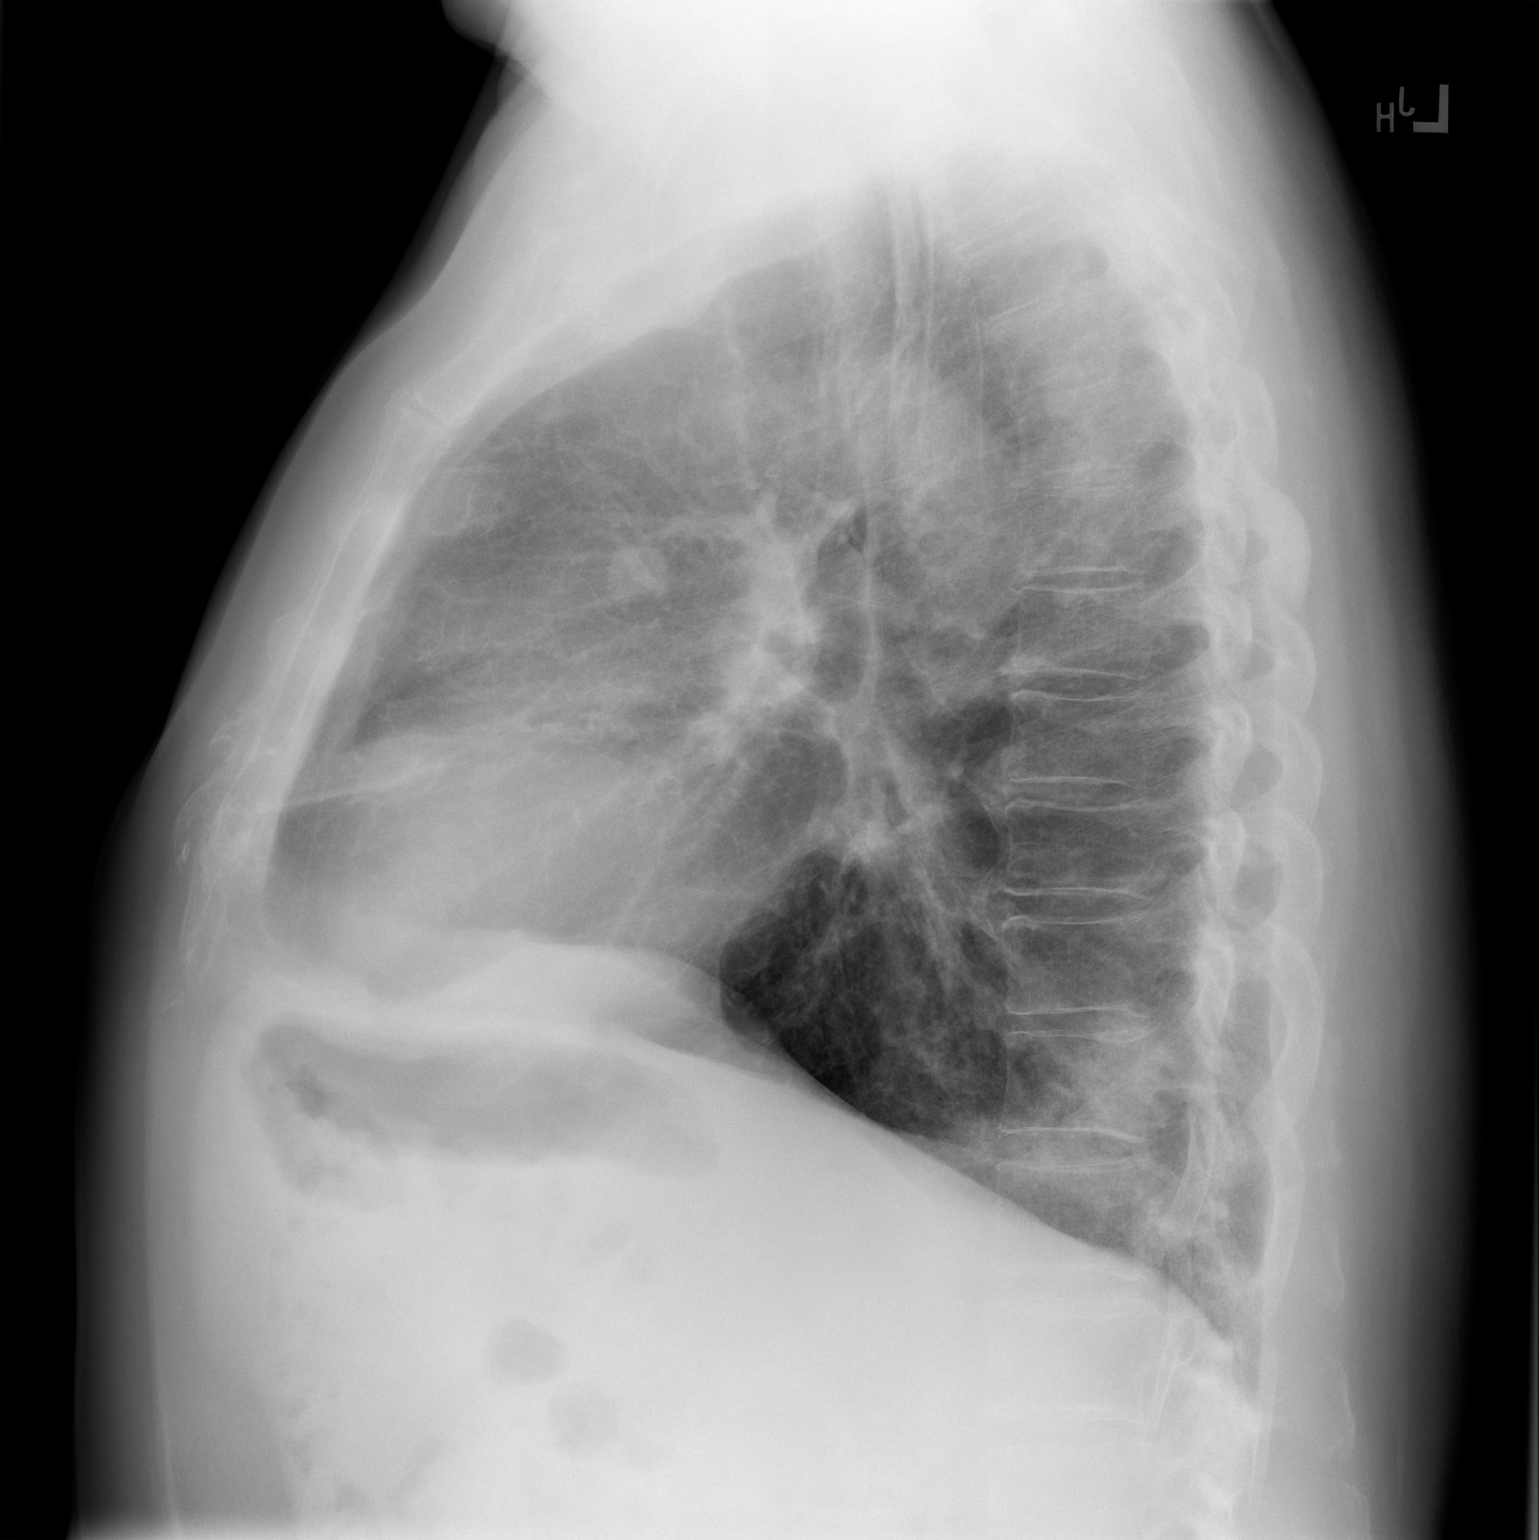

[2 of 2 positions shown; findings below may reference images not displayed]

FINDINGS: There is persistent consolidation in the left lower lobe. More
subtle areas of airspace consolidation are noted in portions of the
right middle and lower lobes, also seen on recent CT. No new
parenchymal lung opacity evident.

There are areas of sclerosis in several bony structures, unchanged
from recent CT.

Heart size and pulmonary vascularity are normal.  No adenopathy.
IMPRESSION: Multifocal pneumonia with consolidation in the posterior left base
as well as in portions of the right middle and lower lobes, also
seen on recent prior CT examination.

Sclerotic bony metastatic disease consistent with known prostate
carcinoma. No adenopathy evident.
